# Patient Record
Sex: Female | Born: 1935 | Race: Black or African American | Hispanic: No
Health system: Southern US, Community
[De-identification: ages and names within clinical notes are randomized; demographics above are authoritative.]

## PROBLEM LIST (undated history)

## (undated) DIAGNOSIS — I639 Cerebral infarction, unspecified: Secondary | ICD-10-CM

## (undated) DIAGNOSIS — I10 Essential (primary) hypertension: Secondary | ICD-10-CM

## (undated) DIAGNOSIS — E119 Type 2 diabetes mellitus without complications: Secondary | ICD-10-CM

## (undated) HISTORY — PX: CHOLECYSTECTOMY: SHX55

---

## 2017-06-15 ENCOUNTER — Encounter (HOSPITAL_COMMUNITY): Payer: Self-pay | Admitting: Emergency Medicine

## 2017-06-15 ENCOUNTER — Emergency Department (HOSPITAL_COMMUNITY): Payer: Medicare Other

## 2017-06-15 ENCOUNTER — Inpatient Hospital Stay (HOSPITAL_COMMUNITY)
Admission: EM | Admit: 2017-06-15 | Discharge: 2017-06-18 | DRG: 871 | Disposition: A | Discharge status: Hospice | Payer: Medicare Other | Attending: Internal Medicine | Admitting: Internal Medicine

## 2017-06-15 ENCOUNTER — Inpatient Hospital Stay (HOSPITAL_COMMUNITY): Payer: Medicare Other

## 2017-06-15 DIAGNOSIS — Z7982 Long term (current) use of aspirin: Secondary | ICD-10-CM

## 2017-06-15 DIAGNOSIS — Z794 Long term (current) use of insulin: Secondary | ICD-10-CM | POA: Diagnosis not present

## 2017-06-15 DIAGNOSIS — E119 Type 2 diabetes mellitus without complications: Secondary | ICD-10-CM | POA: Diagnosis present

## 2017-06-15 DIAGNOSIS — N179 Acute kidney failure, unspecified: Secondary | ICD-10-CM | POA: Diagnosis present

## 2017-06-15 DIAGNOSIS — E875 Hyperkalemia: Secondary | ICD-10-CM | POA: Diagnosis present

## 2017-06-15 DIAGNOSIS — I69351 Hemiplegia and hemiparesis following cerebral infarction affecting right dominant side: Secondary | ICD-10-CM

## 2017-06-15 DIAGNOSIS — Z66 Do not resuscitate: Secondary | ICD-10-CM | POA: Diagnosis present

## 2017-06-15 DIAGNOSIS — Z6820 Body mass index (BMI) 20.0-20.9, adult: Secondary | ICD-10-CM

## 2017-06-15 DIAGNOSIS — J13 Pneumonia due to Streptococcus pneumoniae: Secondary | ICD-10-CM | POA: Diagnosis not present

## 2017-06-15 DIAGNOSIS — I82409 Acute embolism and thrombosis of unspecified deep veins of unspecified lower extremity: Secondary | ICD-10-CM

## 2017-06-15 DIAGNOSIS — J189 Pneumonia, unspecified organism: Secondary | ICD-10-CM

## 2017-06-15 DIAGNOSIS — G9341 Metabolic encephalopathy: Secondary | ICD-10-CM | POA: Diagnosis present

## 2017-06-15 DIAGNOSIS — M7989 Other specified soft tissue disorders: Secondary | ICD-10-CM | POA: Diagnosis not present

## 2017-06-15 DIAGNOSIS — K921 Melena: Secondary | ICD-10-CM | POA: Diagnosis present

## 2017-06-15 DIAGNOSIS — R6521 Severe sepsis with septic shock: Secondary | ICD-10-CM | POA: Diagnosis present

## 2017-06-15 DIAGNOSIS — I10 Essential (primary) hypertension: Secondary | ICD-10-CM | POA: Diagnosis present

## 2017-06-15 DIAGNOSIS — I82441 Acute embolism and thrombosis of right tibial vein: Secondary | ICD-10-CM | POA: Diagnosis present

## 2017-06-15 DIAGNOSIS — R634 Abnormal weight loss: Secondary | ICD-10-CM | POA: Diagnosis present

## 2017-06-15 DIAGNOSIS — Z79899 Other long term (current) drug therapy: Secondary | ICD-10-CM | POA: Diagnosis not present

## 2017-06-15 DIAGNOSIS — J181 Lobar pneumonia, unspecified organism: Secondary | ICD-10-CM | POA: Diagnosis present

## 2017-06-15 DIAGNOSIS — Z515 Encounter for palliative care: Secondary | ICD-10-CM | POA: Diagnosis present

## 2017-06-15 DIAGNOSIS — G929 Unspecified toxic encephalopathy: Secondary | ICD-10-CM

## 2017-06-15 DIAGNOSIS — K922 Gastrointestinal hemorrhage, unspecified: Secondary | ICD-10-CM

## 2017-06-15 DIAGNOSIS — A419 Sepsis, unspecified organism: Principal | ICD-10-CM

## 2017-06-15 DIAGNOSIS — I82431 Acute embolism and thrombosis of right popliteal vein: Secondary | ICD-10-CM | POA: Diagnosis present

## 2017-06-15 DIAGNOSIS — G92 Toxic encephalopathy: Secondary | ICD-10-CM

## 2017-06-15 DIAGNOSIS — E109 Type 1 diabetes mellitus without complications: Secondary | ICD-10-CM

## 2017-06-15 DIAGNOSIS — R627 Adult failure to thrive: Secondary | ICD-10-CM | POA: Diagnosis present

## 2017-06-15 HISTORY — DX: Essential (primary) hypertension: I10

## 2017-06-15 HISTORY — DX: Type 2 diabetes mellitus without complications: E11.9

## 2017-06-15 HISTORY — DX: Cerebral infarction, unspecified: I63.9

## 2017-06-15 LAB — CBC WITH DIFFERENTIAL/PLATELET
Basophils Absolute: 0 10*3/uL (ref 0.0–0.1)
Basophils Relative: 0 %
EOS PCT: 0 %
Eosinophils Absolute: 0 10*3/uL (ref 0.0–0.7)
HCT: 36 % (ref 36.0–46.0)
Hemoglobin: 11.4 g/dL — ABNORMAL LOW (ref 12.0–15.0)
LYMPHS PCT: 13 %
Lymphs Abs: 3.2 10*3/uL (ref 0.7–4.0)
MCH: 28.8 pg (ref 26.0–34.0)
MCHC: 31.7 g/dL (ref 30.0–36.0)
MCV: 90.9 fL (ref 78.0–100.0)
MONO ABS: 0.7 10*3/uL (ref 0.1–1.0)
MONOS PCT: 3 %
NEUTROS PCT: 84 %
Neutro Abs: 21 10*3/uL — ABNORMAL HIGH (ref 1.7–7.7)
PLATELETS: 519 10*3/uL — AB (ref 150–400)
RBC: 3.96 MIL/uL (ref 3.87–5.11)
RDW: 15.5 % (ref 11.5–15.5)
WBC: 24.9 10*3/uL — AB (ref 4.0–10.5)

## 2017-06-15 LAB — CBC
HEMATOCRIT: 30.5 % — AB (ref 36.0–46.0)
Hemoglobin: 9.3 g/dL — ABNORMAL LOW (ref 12.0–15.0)
MCH: 29.4 pg (ref 26.0–34.0)
MCHC: 30.5 g/dL (ref 30.0–36.0)
MCV: 96.5 fL (ref 78.0–100.0)
Platelets: 455 10*3/uL — ABNORMAL HIGH (ref 150–400)
RBC: 3.16 MIL/uL — ABNORMAL LOW (ref 3.87–5.11)
RDW: 15.4 % (ref 11.5–15.5)
WBC: 31.5 10*3/uL — ABNORMAL HIGH (ref 4.0–10.5)

## 2017-06-15 LAB — POC OCCULT BLOOD, ED: Fecal Occult Bld: POSITIVE — AB

## 2017-06-15 LAB — URINALYSIS, ROUTINE W REFLEX MICROSCOPIC
BILIRUBIN URINE: NEGATIVE
GLUCOSE, UA: 50 mg/dL — AB
Ketones, ur: 20 mg/dL — AB
Nitrite: NEGATIVE
PH: 6 (ref 5.0–8.0)
Protein, ur: 100 mg/dL — AB
SPECIFIC GRAVITY, URINE: 1.009 (ref 1.005–1.030)

## 2017-06-15 LAB — BASIC METABOLIC PANEL
BUN: 71 mg/dL — ABNORMAL HIGH (ref 6–20)
CHLORIDE: 98 mmol/L — AB (ref 101–111)
CO2: <7 mmol/L — ABNORMAL LOW (ref 22–32)
Calcium: 9 mg/dL (ref 8.9–10.3)
Creatinine, Ser: 7.81 mg/dL — ABNORMAL HIGH (ref 0.44–1.00)
GFR calc non Af Amer: 4 mL/min — ABNORMAL LOW (ref >60)
GFR, EST AFRICAN AMERICAN: 5 mL/min — AB (ref >60)
Glucose, Bld: 126 mg/dL — ABNORMAL HIGH (ref 65–99)
POTASSIUM: 5.5 mmol/L — AB (ref 3.5–5.1)
Sodium: 138 mmol/L (ref 135–145)

## 2017-06-15 LAB — TROPONIN I: Troponin I: 0.03 ng/mL (ref <0.03)

## 2017-06-15 LAB — LACTIC ACID, PLASMA: LACTIC ACID, VENOUS: 18.6 mmol/L — AB (ref 0.5–1.9)

## 2017-06-15 LAB — GLUCOSE, CAPILLARY: GLUCOSE-CAPILLARY: 120 mg/dL — AB (ref 65–99)

## 2017-06-15 LAB — MRSA PCR SCREENING: MRSA BY PCR: NEGATIVE

## 2017-06-15 MED ORDER — VANCOMYCIN HCL IN DEXTROSE 1-5 GM/200ML-% IV SOLN
1000.0000 mg | Freq: Once | INTRAVENOUS | Status: AC
  Administered 2017-06-15: 1000 mg via INTRAVENOUS
  Filled 2017-06-15: qty 200

## 2017-06-15 MED ORDER — HEPARIN SODIUM (PORCINE) 5000 UNIT/ML IJ SOLN
5000.0000 [IU] | Freq: Three times a day (TID) | INTRAMUSCULAR | Status: DC
  Administered 2017-06-15 – 2017-06-17 (×5): 5000 [IU] via SUBCUTANEOUS
  Filled 2017-06-15 (×5): qty 1

## 2017-06-15 MED ORDER — ONDANSETRON HCL 4 MG/2ML IJ SOLN: 4.0000 mg | Freq: Four times a day (QID) | INTRAMUSCULAR | Status: DC | PRN

## 2017-06-15 MED ORDER — INSULIN ASPART 100 UNIT/ML ~~LOC~~ SOLN
0.0000 [IU] | Freq: Three times a day (TID) | SUBCUTANEOUS | Status: DC
  Administered 2017-06-16: 1 [IU] via SUBCUTANEOUS

## 2017-06-15 MED ORDER — SODIUM CHLORIDE 0.9 % IV SOLN
INTRAVENOUS | Status: DC
  Administered 2017-06-15 – 2017-06-17 (×5): via INTRAVENOUS

## 2017-06-15 MED ORDER — PANTOPRAZOLE SODIUM 40 MG IV SOLR
40.0000 mg | INTRAVENOUS | Status: DC
  Administered 2017-06-15 – 2017-06-16 (×2): 40 mg via INTRAVENOUS
  Filled 2017-06-15 (×2): qty 40

## 2017-06-15 MED ORDER — SODIUM CHLORIDE 0.9 % IV BOLUS (SEPSIS)
2000.0000 mL | Freq: Once | INTRAVENOUS | Status: AC
  Administered 2017-06-15: 2000 mL via INTRAVENOUS

## 2017-06-15 MED ORDER — SODIUM CHLORIDE 0.9 % IV BOLUS (SEPSIS)
500.0000 mL | Freq: Once | INTRAVENOUS | Status: AC
  Administered 2017-06-15: 500 mL via INTRAVENOUS

## 2017-06-15 MED ORDER — DEXTROSE 5 % IV SOLN
2.0000 g | Freq: Once | INTRAVENOUS | Status: AC
  Administered 2017-06-15: 2 g via INTRAVENOUS
  Filled 2017-06-15: qty 2

## 2017-06-15 MED ORDER — DEXTROSE 5 % IV SOLN
500.0000 mg | INTRAVENOUS | Status: DC
  Administered 2017-06-16: 500 mg via INTRAVENOUS
  Filled 2017-06-15 (×2): qty 0.5

## 2017-06-15 NOTE — ED Notes (Signed)
Patient transported to X-ray 

## 2017-06-15 NOTE — ED Notes (Signed)
hospitalist at bedside

## 2017-06-15 NOTE — Progress Notes (Signed)
On admission to ICU patient noted to be cold temperature not registering axillary or oraly, Rectal temp read 89.5 farenheit machines switched out and equipment changed to verify temp and second showed 89.6 farenheit. MD paged and warm blankets applied.

## 2017-06-15 NOTE — Progress Notes (Signed)
eLink Physician-Brief Progress Note Patient Name: Krista Smith DOB: [birth year] MRN: [id number]   Date of Service  06/15/2017  HPI/Events of Note  Old CVA with sepsis, ? RLL pna vs UTI, hypothermic, AKI  eICU Interventions  Check lactate      Intervention Category Evaluation Type: New Patient Evaluation  ALVA,RAKESH V. 06/15/2017, 5:57 PM

## 2017-06-15 NOTE — Consult Note (Addendum)
Renal Service Consult Note [facility]  Krista Smith 06/15/2017 Delano Metz D Requesting Physician:  Dr Mickle Mallory  Reason for Consult:  Renal failure HPI: The patient is a 83 y.o. year-old with hx of CVA May 2018, HTN, DM2 on oral agents who presented to ED today with FTT, not eating x 5 mos since CVA, dark stool, poor UOP.  In ED creat was 7, pt admitted, renal team consulted for renal failure.  Pt's daughter denies any hx of renal failure, no nsaid use.  Pt had CVA in May in [place] w/ R sided weakness and R facial droop.  She says that since then she has not had good po intake. She has lost " 50 pounds".  Her daughter moved her here to [place] with her husband in the last 2-3 weeks.  Still not improving so brought her to ED today.  Pt provides no hx.   Per family pt has long hx of HTN, and 10 yr hx DM on oral agents.  Only surg was possible GB surgery.  NO hx MI/ CHF/ cancer/ DVT.     ROS  n/a   Past Medical History  Past Medical History:  Diagnosis Date  . Diabetes mellitus without complication (HCC)   . Hypertension   . Stroke [facility])    Past Surgical History  Past Surgical History:  Procedure Laterality Date  . CHOLECYSTECTOMY     Family History No family history on file. Social History  has no tobacco, alcohol, and drug history on file. Allergies No Known Allergies Home medications Prior to Admission medications   Medication Sig Start Date End Date Taking? Authorizing Provider  ASPIRIN LOW DOSE 81 MG chewable tablet Chew 81 mg by mouth daily. 03/12/17  Yes [provider]  atenolol-chlorthalidone (TENORETIC) 100-25 MG tablet Take 1 tablet by mouth daily. 06/06/17  Yes [provider]  LANTUS SOLOSTAR 100 UNIT/ML Solostar Pen Inject 24 Units into the skin every evening. 03/13/17  Yes [provider]  metFORMIN (GLUCOPHAGE) 850 MG tablet Take 850 mg by mouth daily with breakfast. 06/06/17  Yes [provider]  NIFEdipine  (PROCARDIA XL/ADALAT-CC) 60 MG 24 hr tablet Take 60 mg by mouth daily. 06/06/17  Yes [provider]   Liver Function Tests  Recent Labs Lab 06/15/17 1123  AST 36  ALT <5*  ALKPHOS 80  BILITOT 1.1  PROT 7.4  ALBUMIN 3.4*   No results for input(s): LIPASE, AMYLASE in the last 168 hours. CBC  Recent Labs Lab 06/15/17 1123  WBC 24.9*  NEUTROABS 21.0*  HGB 11.4*  HCT 36.0  MCV 90.9  PLT 519*   Basic Metabolic Panel  Recent Labs Lab 06/15/17 1123  NA 138  K 5.0  CL 94*  CO2 6*  GLUCOSE 83  BUN 68*  CREATININE 7.74*  CALCIUM 9.6   Iron/TIBC/Ferritin/ %Sat No results found for: IRON, TIBC, FERRITIN, IRONPCTSAT  Vitals:   06/15/17 1230 06/15/17 1300 06/15/17 1330 06/15/17 1400  BP: 140/69 138/63 140/75 139/68  Pulse:   72 71  Resp: (!) 22 (!) 23 (!) 23 19  Temp:      TempSrc:      SpO2:   100% 100%   Exam Gen elderly AAF, moaning, able to respond ok, restless, confused, awake +Kussmaul's respirations No rash, cyanosis or gangrene Sclera anicteric, throat clear  No jvd or bruits Chest clear bilat to bases RRR no MRG Abd soft ntnd no mass or ascites +bs GU defer MS no joint effusions  or deformity Ext 1+ RLE edema , no LLE edema / no wounds or ulcers Neuro R facial droop, gen'd weakness, O x 1  Home meds: -procardia XL 60 qd/ atenolol- chlorthalidone 100- 25 qd/ asa -lantus solostar 24 u qpm / metformin 850 qam    Na 138 K 5.0  CO2 6  BUN 68  Cr 7.74  Ca 9.6  Alb 3.4  Hb 11.4  WBC 24.9 UA hazy, yellow, +ketones, mod LE/ neg nitrite, 6 pH, 100 prot, 6-30 rbc/ wbc, few bact CXR - basically no active disease, per rad possible RLL infiltrate (early) Renal US > Length: 10.2 cm. Echogenicity within normal limits. No mass or hydronephrosis visualized.  Left Kidney: Length: 10.3 cm. Echogenicity within normal limits. No mass or hydronephrosis visualized.  Impression: 1.  Renal failure - possibly acute KI, related to vol depletion.  No ACEi/ARB or  nsaids.  Not septic, but looks dry on exam.  BP's wnl.  Could have GN with abnl UA. Renal US not c/w chronic / severe disease.  Will assume AKI for now, due to vol depletion.  Will give IVF's with 2 L bolus, cont IVF's, dc IV vanc  Hopefully Cr will improve.  2.  Gen weakness/ FTT - multifact 3.  Hx CVA - may 2019, R hemiparesis 4.  DM2 - getting SSI here 5.  HTN - taking 3 BP meds at baseline    Plan - as above  Vinson Moselle MD [employer] pager [phone]   06/15/2017, 3:38 PM

## 2017-06-15 NOTE — Progress Notes (Signed)
CRITICAL VALUE ALERT  Critical Value:  LACTIC 18.6  Date & Time Notied:  06/15/17 1946  Provider Notified: YES, PAGED 1958  Orders Received/Actions taken: SENT PAGE, AWAITING ORDERS

## 2017-06-15 NOTE — ED Provider Notes (Addendum)
[facility]-EMERGENCY DEPT Provider Note   CSN: [id number] Arrival date & time: 06/15/17  0931     History   Chief Complaint Chief Complaint  Patient presents with  . Melena    HPI Krista Smith is a 83 y.o. female.  HPI Level 5 caveat due to altered mental status  Patient brought in by daughter. Recently moved from [place]. Had a stroke in May. Since then has been dwindling down. Has lost around 50 pounds. Has had decreased oral intake. Has been having nausea and had some Tesoro stool yesterday. Decreased oral intake. No fevers. Has also coughed up some green sputum. Has some mild confusion. Normally gets up with assistance but is unable to that recently. Had reportedly had UTI. Just started on medications for it. Does not have a primary care doctor here yet. Normally speaks quiet and has some diffuse wheezing this but worse on the right side. Has been even more week all over for the last few days. Family is concerned she has a urinary tract infection. Past Medical History:  Diagnosis Date  . Diabetes mellitus without complication (HCC)   . Hypertension   . Stroke [facility])     There are no active problems to display for this patient.   Past Surgical History:  Procedure Laterality Date  . CHOLECYSTECTOMY      OB History    No data available       Home Medications    Prior to Admission medications   Medication Sig Start Date End Date Taking? Authorizing Provider  ASPIRIN LOW DOSE 81 MG chewable tablet Chew 81 mg by mouth daily. 03/12/17  Yes [provider]  atenolol-chlorthalidone (TENORETIC) 100-25 MG tablet Take 1 tablet by mouth daily. 06/06/17  Yes [provider]  LANTUS SOLOSTAR 100 UNIT/ML Solostar Pen Inject 24 Units into the skin every evening. 03/13/17  Yes [provider]  metFORMIN (GLUCOPHAGE) 850 MG tablet Take 850 mg by mouth daily with breakfast. 06/06/17  Yes [provider]  NIFEdipine (PROCARDIA XL/ADALAT-CC) 60 MG 24 hr  tablet Take 60 mg by mouth daily. 06/06/17  Yes [provider]    Family History No family history on file.  Social History Social History  Substance Use Topics  . Smoking status: Not on file  . Smokeless tobacco: Not on file  . Alcohol use Not on file     Allergies   Patient has no known allergies.   Review of Systems Review of Systems  Unable to perform ROS: Mental status change  Constitutional: Positive for appetite change.     Physical Exam Updated Vital Signs BP (!) 141/66   Pulse 65   Temp 97.9 F (36.6 C) (Oral)   Resp (!) 21   SpO2 100%   Physical Exam  Constitutional: She appears well-developed.  HENT:  Head: Normocephalic.  Eyes: Pupils are equal, round, and reactive to light.  Neck: Neck supple.  Cardiovascular: Normal rate.   Pulmonary/Chest: Effort normal.  Abdominal: Soft. There is no tenderness.  Genitourinary:  Genitourinary Comments: Brown stool but anterior mass on rectal exam.  Musculoskeletal:  Edema on bilateral lower extremities, worse on the right side.  Neurological: She is alert.  [place] to answer. Answer some questions but also appears to have some confusion. Most of the history comes from the patient's daughter.  Skin: Skin is warm. Capillary refill takes less than 2 seconds.     ED Treatments / Results  Labs (all labs ordered are listed, but only abnormal  results are displayed) Labs Reviewed  COMPREHENSIVE METABOLIC PANEL - Abnormal; Notable for the following:       Result Value   Chloride 94 (*)    CO2 6 (*)    BUN 68 (*)    Creatinine, Ser 7.74 (*)    Albumin 3.4 (*)    ALT <5 (*)    GFR calc non Af Amer 4 (*)    GFR calc Af Amer 5 (*)    Anion gap 38 (*)    All other components within normal limits  CBC WITH DIFFERENTIAL/PLATELET - Abnormal; Notable for the following:    WBC 24.9 (*)    Hemoglobin 11.4 (*)    Platelets 519 (*)    Neutro Abs 21.0 (*)    All other components within normal limits  TROPONIN  I - Abnormal; Notable for the following:    Troponin I 0.03 (*)    All other components within normal limits  POC OCCULT BLOOD, ED - Abnormal; Notable for the following:    Fecal Occult Bld POSITIVE (*)    All other components within normal limits  URINALYSIS, ROUTINE W REFLEX MICROSCOPIC    EKG  EKG Interpretation  Date/Time:  Sunday June 15 2017 12:03:16 EDT Ventricular Rate:  64 PR Interval:    QRS Duration: 103 QT Interval:  523 QTC Calculation: 540 R Axis:   9 Text Interpretation:  Sinus or ectopic atrial rhythm Prolonged QT interval Confirmed by Benjiman Core [phone]) on 06/15/2017 12:52:05 PM       Radiology Dg Chest 2 View  Result Date: 06/15/2017 CLINICAL DATA:  Acute weakness.  CVA in 01/2017. EXAM: CHEST  2 VIEW COMPARISON:  None. FINDINGS: The cardiomediastinal silhouette is unremarkable. Ill-defined 3 cm opacity within the right lower lobe may represent pneumonia. There is no evidence of pulmonary edema, suspicious pulmonary nodule/mass, pleural effusion, or pneumothorax. No acute bony abnormalities are identified. IMPRESSION: Ill-defined right lower lobe opacity which may represent pneumonia. Radiographic follow-up to resolution recommended. Electronically Signed   By: Harmon Pier M.D.   On: 06/15/2017 12:11    Procedures Procedures (including critical care time)  Medications Ordered in ED Medications  vancomycin (VANCOCIN) IVPB 1000 mg/200 mL premix (not administered)  sodium chloride 0.9 % bolus 500 mL (not administered)  ceFEPIme (MAXIPIME) 2 g in dextrose 5 % 50 mL IVPB (2 g Intravenous New Bag/Given 06/15/17 1307)     Initial Impression / Assessment and Plan / ED Course  I have reviewed the triage vital signs and the nursing notes.  Pertinent labs & imaging results that were available during my care of the patient were reviewed by me and considered in my medical decision making (see chart for details).     Patient with decreased oral intake and  has had confusions. Reportedly has been coughing up sputum and possible pneumonia on x-ray. Creatinine is now 7.7 however. There is concern with family. Tract infection with patient is not able to urinate here and will be checking a bladder scan. Will admit to hospitalist. Also was guaiac positive. Did have a fullness anteriorly on rectal exam. Only 30 mL of urine on bladder scan. Will do in and out cath.  Final Clinical Impressions(s) / ED Diagnoses   Final diagnoses:  Community acquired pneumonia, unspecified laterality  Acute kidney injury (HCC)  Gastrointestinal hemorrhage, unspecified gastrointestinal hemorrhage type    New Prescriptions New Prescriptions   No medications on file     Benjiman Core, MD 06/15/17 1349  Benjiman Core, MD 06/15/17 1353

## 2017-06-15 NOTE — Progress Notes (Signed)
Pharmacy Antibiotic Note  Krista Smith is a 83 y.o. female admitted on 06/15/2017 with HCAP.  Pharmacy has been consulted for cefepime and vancomycin dosing.  Plan:  Cefepime 500 mg q24 hr; f/u SCr closely  Vanc discontinued by nephrology d/t AKI. Nephrologist did not want to add another MRSA-active agent (did not feel patient actually had PNA)     Temp (24hrs), Avg:97.9 F (36.6 C), Min:97.9 F (36.6 C), Max:97.9 F (36.6 C)   Recent Labs Lab 06/15/17 1123  WBC 24.9*  CREATININE 7.74*    CrCl cannot be calculated (Unknown ideal weight.).    No Known Allergies  Antimicrobials this admission: Vanc x 1 in ED Cefepime 9/16 >>   Dose adjustments this admission: ---  Microbiology results: 9/16 BCx: sent 9/16 Sputum: sent  9/16 MRSA PCR: sent  Thank you for allowing pharmacy to be a part of this patient's care.  Bernadene Person, PharmD, BCPS Pager: [phone] 06/15/2017, 5:23 PM

## 2017-06-15 NOTE — Progress Notes (Signed)
Pharmacy Note:  Initial antibiotics for Vancomycin & Cefepime ordered by EDP for presumed HCAP.  CrCl cannot be calculated (Unknown ideal weight.).   No Known Allergies  Vitals:   06/15/17 1159 06/15/17 1200  BP: (!) 142/68 (!) 141/66  Pulse: 65   Resp: (!) 23 (!) 21  Temp:    SpO2: 100%     Anti-infectives    Start     Dose/Rate Route Frequency Ordered Stop   06/15/17 1300  ceFEPIme (MAXIPIME) 2 g in dextrose 5 % 50 mL IVPB     2 g 100 mL/hr over 30 Minutes Intravenous  Once 06/15/17 1249     06/15/17 1300  vancomycin (VANCOCIN) IVPB 1000 mg/200 mL premix     1,000 mg 200 mL/hr over 60 Minutes Intravenous  Once 06/15/17 1249        Plan: Initial doses of Cefepime 2gm & Vancomycin 1gm  X 1 ordered. F/U admission orders for further dosing if therapy continued. Protocol orders discontinued  Otho Bellows, [facility] 06/15/2017 12:57 PM

## 2017-06-15 NOTE — ED Notes (Signed)
Off floor for testing 

## 2017-06-15 NOTE — H&P (Signed)
History and Physical  Krista Smith ONG:[id number] DOB: [birth year] DOA: 06/15/2017  PCP:  No primary care provider on file.   Chief Complaint:  AMS weakness  History of Present Illness:  Pt is a 83 yo female with hx of HTN, DMII, CVA who was brought with cc of AMS and weakness. Pt had CVA in may s/p rehab in [place] then her daughter had her moved to here to help her out ( daughter is a PT) as she was not happy with her physical therapy progress. The patient has not been doing well and continued to have weakness and poor appetite and lost 50 lbs over 5 months. Three days ago she started having AMS/being confused and today she had several episodes of vomiting of thick green mucus and had tarry stool but did not complaint of pain/dysuria and did not have fever/chills/chest pain/dyspnea. Pt is unable to give history.   Review of Systems:  Unable to get due to AMS  Other:  Past Medical and Surgical History:   Past Medical History:  Diagnosis Date  . Diabetes mellitus without complication (HCC)   . Hypertension   . Stroke [facility])    Past Surgical History:  Procedure Laterality Date  . CHOLECYSTECTOMY      Social History:   has no tobacco, alcohol, and drug history on file.    No Known Allergies  No family history on file.    Prior to Admission medications   Medication Sig Start Date End Date Taking? Authorizing Provider  ASPIRIN LOW DOSE 81 MG chewable tablet Chew 81 mg by mouth daily. 03/12/17  Yes [provider]  atenolol-chlorthalidone (TENORETIC) 100-25 MG tablet Take 1 tablet by mouth daily. 06/06/17  Yes [provider]  LANTUS SOLOSTAR 100 UNIT/ML Solostar Pen Inject 24 Units into the skin every evening. 03/13/17  Yes [provider]  metFORMIN (GLUCOPHAGE) 850 MG tablet Take 850 mg by mouth daily with breakfast. 06/06/17  Yes [provider]  NIFEdipine (PROCARDIA XL/ADALAT-CC) 60 MG 24 hr tablet Take 60 mg by mouth daily.  06/06/17  Yes [provider]    Physical Exam: BP 139/68   Pulse 71   Temp 97.9 F (36.6 C) (Oral)   Resp 19   SpO2 100%   GENERAL :   Awake, oriented to city but not to people , time or self.  HEAD:           normocephalic. EARS:           hearing grossly intact. NECK:          supple, CARDIAC:    Normal S1 and S2. No gallop. No murmurs.  Vascular:     + L leg peripheral edema.  LUNGS:        Mild crackles  ABDOMEN: Positive bowel sounds. Soft, nondistended, nontender.  MSK:           No joint erythema or tenderness.  EXT           : No significant deformity or joint abnormality. Neuro        : unable to assess due to AMS. Moving left leg/arm and right arm voluntarily  SKIN:            No rash. No lesions.           Labs on Admission:  Reviewed.   Radiological Exams on Admission: Dg Chest 2 View  Result Date: 06/15/2017 CLINICAL DATA:  Acute weakness.  CVA in 01/2017. EXAM: CHEST  2 VIEW COMPARISON:  None. FINDINGS: The cardiomediastinal silhouette is unremarkable. Ill-defined 3 cm opacity within the right lower lobe may represent pneumonia. There is no evidence of pulmonary edema, suspicious pulmonary nodule/mass, pleural effusion, or pneumothorax. No acute bony abnormalities are identified. IMPRESSION: Ill-defined right lower lobe opacity which may represent pneumonia. Radiographic follow-up to resolution recommended. Electronically Signed   By: Harmon Pier M.D.   On: 06/15/2017 12:11    EKG:  Independently reviewed. Sinus rythem  Assessment/Plan  Altered mental status: Baseline: A,O#3 Ddx:   Neurologic:           Structural (mass)/ bleeding: head CT scan ordered          Stroke / TIA: will check MRI if not improving by am Infectious:           UA ordered. CXR suggest pneumonia  Metabolic : uremia/ AKI possibly contributing              Sepsis :  Due to pneumonia Started on van/cefepime , send for bcx and sputum cx  AKI: Unknown if she has  underlying CKD Will get renal US Continue IVF Nephrology consult   Melena:  Only this morning per daughter No hematemesis Hold asp, keep NPO for now Repeat H&H in pm Consult to GI   HTN: hold BP meds for now  DMII: hold meds and keep on SS insulin   CVA: hold asp due to GIB, need more PT before/upon discharge.   Weight loss/poor appetite: need age appropriate cancer screening once stable   Input & Output: ordered  Lines & Tubes: PIV DVT prophylaxis: SCDs GI prophylaxis: PPI  Consultants: GI / nephro  Code Status: full Family Communication: at bedside  Disposition Plan: TBD    Krista Smith M.D [facility]

## 2017-06-15 NOTE — ED Triage Notes (Signed)
Patient BIB daughter, reports patient moved to live with her x2 weeks ago. Since stroke in May, daughter reports patient has had decreased appetite and poor nutrition. Daughter reports since yesterday, patient has been spitting up mucus and has had one dark stool. Patient denies pain. Daughter denies patient coughing. A&Ox3 in triage. Daughter expressing concern for UTI.

## 2017-06-15 NOTE — ED Notes (Signed)
Pt's bladder scan: 39 ml

## 2017-06-15 NOTE — ED Notes (Addendum)
Notified hospitalist of one set of cultures sent after hanging antibiotics-stated that was fine-US at bedside

## 2017-06-16 ENCOUNTER — Encounter (HOSPITAL_COMMUNITY): Payer: Self-pay

## 2017-06-16 ENCOUNTER — Inpatient Hospital Stay (HOSPITAL_COMMUNITY): Payer: Medicare Other

## 2017-06-16 ENCOUNTER — Encounter (HOSPITAL_COMMUNITY): Payer: Medicare Other

## 2017-06-16 DIAGNOSIS — E875 Hyperkalemia: Secondary | ICD-10-CM

## 2017-06-16 DIAGNOSIS — N179 Acute kidney failure, unspecified: Secondary | ICD-10-CM

## 2017-06-16 DIAGNOSIS — I10 Essential (primary) hypertension: Secondary | ICD-10-CM

## 2017-06-16 DIAGNOSIS — G929 Unspecified toxic encephalopathy: Secondary | ICD-10-CM

## 2017-06-16 DIAGNOSIS — G92 Toxic encephalopathy: Secondary | ICD-10-CM

## 2017-06-16 DIAGNOSIS — K921 Melena: Secondary | ICD-10-CM

## 2017-06-16 DIAGNOSIS — J181 Lobar pneumonia, unspecified organism: Secondary | ICD-10-CM

## 2017-06-16 DIAGNOSIS — E109 Type 1 diabetes mellitus without complications: Secondary | ICD-10-CM

## 2017-06-16 DIAGNOSIS — M7989 Other specified soft tissue disorders: Secondary | ICD-10-CM

## 2017-06-16 LAB — CBC
HCT: 27.2 % — ABNORMAL LOW (ref 36.0–46.0)
Hemoglobin: 8.4 g/dL — ABNORMAL LOW (ref 12.0–15.0)
MCH: 29.4 pg (ref 26.0–34.0)
MCHC: 30.9 g/dL (ref 30.0–36.0)
MCV: 95.1 fL (ref 78.0–100.0)
Platelets: 381 10*3/uL (ref 150–400)
RBC: 2.86 MIL/uL — AB (ref 3.87–5.11)
RDW: 15.8 % — AB (ref 11.5–15.5)
WBC: 38.7 10*3/uL — ABNORMAL HIGH (ref 4.0–10.5)

## 2017-06-16 LAB — COMPREHENSIVE METABOLIC PANEL
ALBUMIN: 3.4 g/dL — AB (ref 3.5–5.0)
ALK PHOS: 80 U/L (ref 38–126)
ALK PHOS: 63 U/L (ref 38–126)
ALT: <5 U/L — ABNORMAL LOW (ref 14–54)
ALT: 30 U/L (ref 14–54)
ANION GAP: 38 — AB (ref 5–15)
AST: 36 U/L (ref 15–41)
AST: 60 U/L — AB (ref 15–41)
Albumin: 2.8 g/dL — ABNORMAL LOW (ref 3.5–5.0)
BILIRUBIN TOTAL: 1.1 mg/dL (ref 0.3–1.2)
BUN: 68 mg/dL — ABNORMAL HIGH (ref 6–20)
BUN: 76 mg/dL — AB (ref 6–20)
CALCIUM: 9.6 mg/dL (ref 8.9–10.3)
CALCIUM: 8.3 mg/dL — AB (ref 8.9–10.3)
CHLORIDE: 102 mmol/L (ref 101–111)
CO2: <7 mmol/L — ABNORMAL LOW (ref 22–32)
CO2: <7 mmol/L — ABNORMAL LOW (ref 22–32)
CREATININE: 7.66 mg/dL — AB (ref 0.44–1.00)
Chloride: 94 mmol/L — ABNORMAL LOW (ref 101–111)
Creatinine, Ser: 7.74 mg/dL — ABNORMAL HIGH (ref 0.44–1.00)
GFR, EST AFRICAN AMERICAN: 5 mL/min — AB (ref >60)
GFR, EST AFRICAN AMERICAN: 5 mL/min — AB (ref >60)
GFR, EST NON AFRICAN AMERICAN: 4 mL/min — AB (ref >60)
GFR, EST NON AFRICAN AMERICAN: 4 mL/min — AB (ref >60)
GLUCOSE: 83 mg/dL (ref 65–99)
Glucose, Bld: 118 mg/dL — ABNORMAL HIGH (ref 65–99)
Potassium: 5 mmol/L (ref 3.5–5.1)
Potassium: 6.2 mmol/L — ABNORMAL HIGH (ref 3.5–5.1)
SODIUM: 139 mmol/L (ref 135–145)
Sodium: 138 mmol/L (ref 135–145)
TOTAL PROTEIN: 7.4 g/dL (ref 6.5–8.1)
Total Bilirubin: 1.1 mg/dL (ref 0.3–1.2)
Total Protein: 5.5 g/dL — ABNORMAL LOW (ref 6.5–8.1)

## 2017-06-16 LAB — GLUCOSE, CAPILLARY
GLUCOSE-CAPILLARY: 100 mg/dL — AB (ref 65–99)
GLUCOSE-CAPILLARY: 89 mg/dL (ref 65–99)
GLUCOSE-CAPILLARY: 65 mg/dL (ref 65–99)
Glucose-Capillary: 127 mg/dL — ABNORMAL HIGH (ref 65–99)
Glucose-Capillary: 164 mg/dL — ABNORMAL HIGH (ref 65–99)

## 2017-06-16 LAB — HEMOGLOBIN A1C
Hgb A1c MFr Bld: 6.8 % — ABNORMAL HIGH (ref 4.8–5.6)
Mean Plasma Glucose: 148.46 mg/dL

## 2017-06-16 LAB — LACTIC ACID, PLASMA
LACTIC ACID, VENOUS: 16.9 mmol/L — AB (ref 0.5–1.9)
Lactic Acid, Venous: 15.3 mmol/L (ref 0.5–1.9)

## 2017-06-16 LAB — POTASSIUM: Potassium: 6.2 mmol/L — ABNORMAL HIGH (ref 3.5–5.1)

## 2017-06-16 MED ORDER — DEXTROSE 50 % IV SOLN
50.0000 mL | Freq: Once | INTRAVENOUS | Status: AC
  Administered 2017-06-16: 50 mL via INTRAVENOUS

## 2017-06-16 MED ORDER — MORPHINE SULFATE (PF) 4 MG/ML IV SOLN: 1.0000 mg | INTRAVENOUS | Status: DC | PRN

## 2017-06-16 MED ORDER — LORAZEPAM 2 MG/ML IJ SOLN: 0.5000 mg | INTRAMUSCULAR | Status: DC | PRN

## 2017-06-16 MED ORDER — SODIUM CHLORIDE 0.9 % IV BOLUS (SEPSIS)
500.0000 mL | Freq: Once | INTRAVENOUS | Status: AC
  Administered 2017-06-16: 500 mL via INTRAVENOUS

## 2017-06-16 MED ORDER — DEXTROSE 50 % IV SOLN
INTRAVENOUS | Status: AC
  Administered 2017-06-16: 22:00:00
  Filled 2017-06-16: qty 50

## 2017-06-16 NOTE — Progress Notes (Signed)
CRITICAL VALUE ALERT  Critical Value: lactate 15.3  Date & Time Notied:  06/16/17 0500  Provider Notified:   Orders Received/Actions taken: MD aware as previously noted

## 2017-06-16 NOTE — Progress Notes (Signed)
 [facility] Progress Note  Subjective: hypotensive overnight and now unresponsive.  DNR.    Vitals:   06/16/17 0400 06/16/17 0436 06/16/17 0500 06/16/17 0800  BP:  (!) 87/50    Pulse: 74 75 73   Resp: (!) 29 (!) 25 (!) 28   Temp:    (!) 96.1 F (35.6 C)  TempSrc:    Axillary  SpO2: 100% 100% 100%   Weight:      Height:        Inpatient medications: . ceFEPime (MAXIPIME) IV  500 mg Intravenous Q24H  . heparin  5,000 Units Subcutaneous Q8H  . insulin aspart  0-9 Units Subcutaneous TID WC  . pantoprazole (PROTONIX) IV  40 mg Intravenous Q24H   . sodium chloride 150 mL/hr at 06/16/17 0300  . sodium chloride 500 mL (06/16/17 0742)   LORazepam, morphine injection, ondansetron (ZOFRAN) IV  Exam: Gen elderly AAF, unresponsive +Kussmaul's respirations  No jvd or bruits Chest clear bilat RRR no MRG Abd soft ntnd no mass or ascites +bs GU defer MS no joint effusions or deformity Ext 1+ RLE edema , no LLE edema / no wounds or ulcers Neuro not responding    Impression: 1.  Renal failure, severe - likely AKI due to sepsis/ hypotension and dehydration. Pt not responding to treatment.  Is now DNR. Pt is not a dialysis candidate.  Will sign off.   2.  Gen weakness/ FTT - multifact 3.  Hx CVA - may 2019, R hemiparesis 4.  DM2 - getting SSI here 5.  HTN - taking 3 BP meds at baseline    Plan - as above   Vinson Moselle MD [facility] pager [phone]   06/16/2017, 8:27 AM    Recent Labs Lab 06/15/17 1123 06/15/17 1813 06/16/17 0239  NA 138 138 139  K 5.0 5.5* 6.2*  6.2*  CL 94* 98* 102  CO2 6* <7* <7*  GLUCOSE 83 126* 118*  BUN 68* 71* 76*  CREATININE 7.74* 7.81* 7.66*  CALCIUM 9.6 9.0 8.3*    Recent Labs Lab 06/15/17 1123 06/16/17 0239  AST 36 60*  ALT <5* 30  ALKPHOS 80 63  BILITOT 1.1 1.1  PROT 7.4 5.5*  ALBUMIN 3.4* 2.8*    Recent Labs Lab 06/15/17 1123 06/15/17 1813 06/16/17 0239  WBC 24.9* 31.5* 38.7*   NEUTROABS 21.0*  --   --   HGB 11.4* 9.3* 8.4*  HCT 36.0 30.5* 27.2*  MCV 90.9 96.5 95.1  PLT 519* 455* 381   Iron/TIBC/Ferritin/ %Sat No results found for: IRON, TIBC, FERRITIN, IRONPCTSAT

## 2017-06-16 NOTE — Progress Notes (Signed)
Nutrition Brief Note  Patient identified on the Malnutrition Screening Tool (MST) Report  Wt Readings from Last 15 Encounters:  06/15/17 123 lb 7.3 oz (56 kg)    Body mass index is 20.54 kg/m. Patient meets criteria for normal weight for height based on current BMI.    Current diet order is NPO. Per chart review pt has been unresponsive to treatment. Spoke with RN regarding pt's status, no known intention on feeding pt at this time.  Labs and medications reviewed.   No nutrition interventions warranted or appropriate at this time.   If nutritional GOC change or status improvement occur, RD to re-evaluate.   If nutrition issues arise, please consult RD.   Fransisca Kaufmann, MS, RDN, LDN 06/16/2017 2:55 PM

## 2017-06-16 NOTE — Progress Notes (Signed)
Krista Smith is admitted with critical illness, including renal failure and suspected PNA. She has apparently been declining rapidly since a stroke a few months ago.   Currently, she is not responding to painful stimuli and BP has been falling despite fluid-resuscitation. She is hypotensive. She is anuric. Brainstem reflexes intact.   I tried reaching her daughter by phone, but it went straight to message. Patient's son, Krista Smith, answered the phone, was aware his mom is here and critically ill, and states that he is the medical decision-maker for the patient.   We discussed the current situation and plan. He reports that "mom definitely wouldn't want any of that" when asked about CPR, intubation, vasopressors. He states that he and his sister already knew that she was likely nearing the end of her life and was not surprised to get my call tonight. He asks that we continue what we are doing to try to get her better, but asks that the patient be DNR, as this would be consistent with the patient's stated wishes. Krista Smith will keep his sister abreast of the situation. He thanked [place] for the call and the care we are providing. He realizes that she very well may not survive the night.

## 2017-06-16 NOTE — Progress Notes (Signed)
Physical Therapy Discharge Patient Details Name: Krista Smith MRN: [id number] DOB: 02/18/1934 Today's Date: 06/16/2017 Time:  -     Patient discharged from PT services secondary to medical decline - will need to re-order PT to resume therapy services.Noted for comfort care. Sepsis symptoms noted.    [facility] [phone]      Rada Hay 06/16/2017, 8:07 AM

## 2017-06-16 NOTE — Progress Notes (Addendum)
VASCULAR LAB PRELIMINARY  PRELIMINARY  PRELIMINARY  PRELIMINARY  Bilateral lower extremity venous duplex completed.    Preliminary report:  Right - Positive for a DVT in the posterior tibial, gastrocnemius, and popliteal veins. There is no evidence of a superficial thrombus or Baker's cyst. Left:  No evidence of DVT, superficial thrombosis, or Baker's cyst.  Johnryan Sao, RVS 06/16/2017, 2:53 PM

## 2017-06-16 NOTE — Progress Notes (Addendum)
Pt blood pressure dropped significantly and notified mD and started a 500 ml fluid bolus per orders. MD showed up and assessed pt and spoke with family. MD entered new orders which include full DNR and state we were not going to do any aggressive treatment and the famuly agreed with this plan. The MD also stated he would monitor labs and he would treat accordingly and that we did not need to inform him of any more critical labs that may arise.

## 2017-06-16 NOTE — Care Management Note (Signed)
Case Management Note  Patient Details  Name: Krista Smith MRN: [id number] Date of Birth: Aug 12, 1934  Subjective/Objective:                  acute renal failure  Action/Plan: Date:  June 16, 2017 Chart reviewed for concurrent status and case management needs. Will continue to follow patient progress. Discharge Planning: following for needs Expected discharge date: [id number] Marcelle Smiling, BSN, [place], [place]   [phone]  Expected Discharge Date:                  Expected Discharge Plan:  Home/Self Care  In-House Referral:     Discharge planning Services  CM Consult  Post Acute Care Choice:    Choice offered to:     DME Arranged:    DME Agency:     HH Arranged:    HH Agency:     Status of Service:  In process, will continue to follow  If discussed at Long Length of Stay Meetings, dates discussed:    Additional Comments:  Golda Acre, RN 06/16/2017, 8:50 AM

## 2017-06-16 NOTE — Progress Notes (Signed)
CRITICAL VALUE ALERT  Critical Value:  Lactic 16.9  Date & Time Notied:  06/16/17 0028  Provider Notified: yes/ paged at 1246  Orders Received/Actions taken: awaiting orders

## 2017-06-16 NOTE — Progress Notes (Signed)
Pt admission documentation is not completed and pt family is not here to assist in its completion.

## 2017-06-16 NOTE — Progress Notes (Signed)
[facility] PROGRESS NOTE    Progress Note  Krista Smith  RUE:[id number] DOB: 1934/08/19 DOA: 06/15/2017 PCP: Patient, No Pcp Per     Brief Narrative:   Krista Smith is an 83 y.o. female past medical history of essential hypertension, diabetes mellitus type 2, history of CVA who is brought to the emergency room for altered mental status.  Assessment/Plan:   Sepsis Shock due to Lobar pneumonia [facility]) Patient has not responded to fluid resuscitation, the overnight physician talked to the family and the family would not want to escalate treatment, they refuse CPR, intubation and vasopressors. The patient mean pressure is less than 61 andand her lactic acid is 16. He is on empiric vancomycin and cefepime, her leukocytosis continues to worsen. I agree with IV morphine for comfort. I tried to call the son and daughter several times and have been unsuccessful. Viviann Spare to discuss with family comfort care measures, as the patient is not responding to treatment.  Toxic encephalopathy Likely due to infectious etiology. She is unresponsive.  AKI (acute kidney injury) (HCC) Likely due to sepsis, with no improvement in renal function despite fluid resuscitation. Will continue IV fluid hydration. Nephrology has been consulted.  Hyperkalemia Likely due to acute renal failure, the patient is not a candidate for dialysis. The family has refused CPR intubation and vasopressors. Renal has been consulted and appreciate assistance.  Melena Of unclear source, her hemoglobin on admission is 9.3 ,Her hemoglobin on admission was 9.3.  Essential hypertension Continue to hold antihypertensive medication.  Type 1 diabetes mellitus without complication (HCC) Blood glucose range a 83-118 continue sliding scale insulin, the patient is nothing by mouth.   DVT prophylaxis: SCD Family Communication:none Disposition Plan/Barrier to D/C: unable to determine Code Status:     Code Status Orders         Start     Ordered   06/16/17 0238  Do not attempt resuscitation (DNR)  Continuous    Question Answer Comment  In the event of cardiac or respiratory ARREST Do not call a "code blue"   In the event of cardiac or respiratory ARREST Do not perform Intubation, CPR, defibrillation or ACLS   In the event of cardiac or respiratory ARREST Use medication by any route, position, wound care, and other measures to relive pain and suffering. May use oxygen, suction and manual treatment of airway obstruction as needed for comfort.      06/16/17 0238    Code Status History    Date Active Date Inactive Code Status Order ID Comments User Context   06/15/2017  2:30 PM 06/16/2017  2:38 AM Full Code [id number]  Eston Esters, MD ED    Advance Directive Documentation     Most Recent Value  Type of Advance Directive  Healthcare Power of Attorney  Pre-existing out of facility DNR order (yellow form or pink MOST form)  -  "MOST" Form in Place?  -        IV Access:    Peripheral IV   Procedures and diagnostic studies:   Dg Chest 2 View  Result Date: 06/15/2017 CLINICAL DATA:  Acute weakness.  CVA in 01/2017. EXAM: CHEST  2 VIEW COMPARISON:  None. FINDINGS: The cardiomediastinal silhouette is unremarkable. Ill-defined 3 cm opacity within the right lower lobe may represent pneumonia. There is no evidence of pulmonary edema, suspicious pulmonary nodule/mass, pleural effusion, or pneumothorax. No acute bony abnormalities are identified. IMPRESSION: Ill-defined right lower lobe opacity which may represent pneumonia. Radiographic follow-up to resolution  recommended. Electronically Signed   By: Harmon Pier M.D.   On: 06/15/2017 12:11   Ct Head Wo Contrast  Result Date: 06/15/2017 CLINICAL DATA:  Worsen altered mental status.  History of stroke. EXAM: CT HEAD WITHOUT CONTRAST TECHNIQUE: Contiguous axial images were obtained from the base of the skull through the vertex without intravenous contrast.  COMPARISON:  None. FINDINGS: Brain: Generalized age related atrophy. The brainstem and cerebellum are unremarkable. There is old infarction in the left basal ganglia and radiating white matter tracts. There is an old left posterior frontal cortical infarction. No sign of acute infarction, intra-axial mass lesion, hemorrhage, hydrocephalus or extra-axial fluid collection. There is a calcified meningioma in the right frontal region measuring 3 cm in diameter. This indents the right frontal lobe slightly but does not appear to be associated with any edema or significant mass effect. There is a 7 mm sessile meningioma in the left frontal region without mass effect. Vascular: There is atherosclerotic calcification of the major vessels at the base of the brain. Skull: Otherwise negative Sinuses/Orbits: Clear/normal Other: None IMPRESSION: No acute finding by CT. Old left basal ganglia and radiating white matter infarctions. Old left frontal cortical and subcortical infarction. Calcified right frontal meningioma measuring 3 cm. Slight indentation of the right frontal lobe, not likely significant. 7 mm sessile meningioma on the left in the frontal region, not significant. Electronically Signed   By: Paulina Fusi M.D.   On: 06/15/2017 15:17   US Renal  Result Date: 06/15/2017 CLINICAL DATA:  Acute kidney injury EXAM: RENAL / URINARY TRACT ULTRASOUND COMPLETE COMPARISON:  None FINDINGS: Right Kidney: Length: 10.2 cm. Echogenicity within normal limits. No mass or hydronephrosis visualized. Left Kidney: Length: 10.3 cm. Echogenicity within normal limits. No mass or hydronephrosis visualized. Bladder: Appears normal for degree of bladder distention. IMPRESSION: 1. Normal renal sonogram. Electronically Signed   By: Signa Kell M.D.   On: 06/15/2017 15:06     Medical Consultants:    None.  Anti-Infectives:   IV vancomycin and cefepime.  Subjective:    Krista Smith unresponsive.  Objective:    Vitals:    06/16/17 0303 06/16/17 0400 06/16/17 0436 06/16/17 0500  BP:   (!) 87/50   Pulse:  74 75 73  Resp:  (!) 29 (!) 25 (!) 28  Temp: (!) 97.5 F (36.4 C)     TempSrc: Oral     SpO2:  100% 100% 100%  Weight:      Height:        Intake/Output Summary (Last 24 hours) at 06/16/17 0714 Last data filed at 06/16/17 0500  Gross per 24 hour  Intake           3412.5 ml  Output               58 ml  Net           3354.5 ml   Filed Weights   06/15/17 1700  Weight: 56 kg (123 lb 7.3 oz)    Exam: General exam: Unresponsive Respiratory system: Good air movement with crackles bilaterally Cardiovascular system: Tachycardic with regular rate and rhythm positive S1-S2 Gastrointestinal system: Abdomen is soft nontender nondistended Central nervous system: Only withdrawing to pain. Extremities: No pedal edema. Skin: No rashes, lesions or ulcers   Data Reviewed:    Labs: Basic Metabolic Panel:  Recent Labs Lab 06/15/17 1123 06/15/17 1813 06/16/17 0239  NA 138 138 139  K 5.0 5.5* 6.2*  6.2*  CL 94* 98*  102  CO2 6* <7* <7*  GLUCOSE 83 126* 118*  BUN 68* 71* 76*  CREATININE 7.74* 7.81* 7.66*  CALCIUM 9.6 9.0 8.3*   GFR Estimated Creatinine Clearance: 5.2 mL/min (A) (by C-G formula based on SCr of 7.66 mg/dL (H)). Liver Function Tests:  Recent Labs Lab 06/15/17 1123 06/16/17 0239  AST 36 60*  ALT <5* 30  ALKPHOS 80 63  BILITOT 1.1 1.1  PROT 7.4 5.5*  ALBUMIN 3.4* 2.8*   No results for input(s): LIPASE, AMYLASE in the last 168 hours. No results for input(s): AMMONIA in the last 168 hours. Coagulation profile No results for input(s): INR, PROTIME in the last 168 hours.  CBC:  Recent Labs Lab 06/15/17 1123 06/15/17 1813 06/16/17 0239  WBC 24.9* 31.5* 38.7*  NEUTROABS 21.0*  --   --   HGB 11.4* 9.3* 8.4*  HCT 36.0 30.5* 27.2*  MCV 90.9 96.5 95.1  PLT 519* 455* 381   Cardiac Enzymes:  Recent Labs Lab 06/15/17 1123  TROPONINI 0.03*   BNP (last 3 results) No  results for input(s): PROBNP in the last 8760 hours. CBG:  Recent Labs Lab 06/15/17 2129  GLUCAP 120*   D-Dimer: No results for input(s): DDIMER in the last 72 hours. Hgb A1c: No results for input(s): HGBA1C in the last 72 hours. Lipid Profile: No results for input(s): CHOL, HDL, LDLCALC, TRIG, CHOLHDL, LDLDIRECT in the last 72 hours. Thyroid function studies: No results for input(s): TSH, T4TOTAL, T3FREE, THYROIDAB in the last 72 hours.  Invalid input(s): FREET3 Anemia work up: No results for input(s): VITAMINB12, FOLATE, FERRITIN, TIBC, IRON, RETICCTPCT in the last 72 hours. Sepsis Labs:  Recent Labs Lab 06/15/17 1123 06/15/17 1813 06/15/17 2322 06/16/17 0239  WBC 24.9* 31.5*  --  38.7*  LATICACIDVEN  --  18.6* 16.9* 15.3*   Microbiology Recent Results (from the past 240 hour(s))  MRSA PCR Screening     Status: None   Collection Time: 06/15/17  4:59 PM  Result Value Ref Range Status   MRSA by PCR NEGATIVE NEGATIVE Final    Comment:        The GeneXpert MRSA Assay (FDA approved for NASAL specimens only), is one component of a comprehensive MRSA colonization surveillance program. It is not intended to diagnose MRSA infection nor to guide or monitor treatment for MRSA infections.      Medications:   . ceFEPime (MAXIPIME) IV  500 mg Intravenous Q24H  . heparin  5,000 Units Subcutaneous Q8H  . insulin aspart  0-9 Units Subcutaneous TID WC  . pantoprazole (PROTONIX) IV  40 mg Intravenous Q24H   Continuous Infusions: . sodium chloride 150 mL/hr at 06/16/17 0300     LOS: 1 day   Marinda Elk  [facility] Pager [phone]  *Please refer to [email or web address], password [email or web address] to get updated schedule on who will round on this patient, as hospitalists switch teams weekly. If 7PM-7AM, please contact night-coverage at [email or web address], password [email or web address] for any overnight needs.  06/16/2017, 7:14 AM

## 2017-06-16 NOTE — Progress Notes (Signed)
D/w Dr. Feliz.  He has had a chance to evaluate patient and no need for formal palliative consult at this time.  As always, we remain available as needed.  Please call or re-consult palliative if we can be of assistance.  Gussie Murton, MD [facility] [phone]  NO CHARGE NOTE 

## 2017-06-17 DIAGNOSIS — J13 Pneumonia due to Streptococcus pneumoniae: Secondary | ICD-10-CM

## 2017-06-17 DIAGNOSIS — J181 Lobar pneumonia, unspecified organism: Secondary | ICD-10-CM

## 2017-06-17 DIAGNOSIS — K921 Melena: Secondary | ICD-10-CM

## 2017-06-17 DIAGNOSIS — I82409 Acute embolism and thrombosis of unspecified deep veins of unspecified lower extremity: Secondary | ICD-10-CM

## 2017-06-17 LAB — KAPPA/LAMBDA LIGHT CHAINS
KAPPA FREE LGHT CHN: 111.8 mg/L — AB (ref 3.3–19.4)
KAPPA, LAMDA LIGHT CHAIN RATIO: 1.92 — AB (ref 0.26–1.65)
LAMDA FREE LIGHT CHAINS: 58.3 mg/L — AB (ref 5.7–26.3)

## 2017-06-17 LAB — CREATININE, SERUM
CREATININE: 7.14 mg/dL — AB (ref 0.44–1.00)
GFR calc Af Amer: 6 mL/min — ABNORMAL LOW (ref >60)
GFR, EST NON AFRICAN AMERICAN: 5 mL/min — AB (ref >60)

## 2017-06-17 LAB — C4 COMPLEMENT: Complement C4, Body Fluid: 18 mg/dL (ref 14–44)

## 2017-06-17 LAB — C3 COMPLEMENT: C3 Complement: 86 mg/dL (ref 82–167)

## 2017-06-17 MED ORDER — LORAZEPAM 2 MG/ML IJ SOLN
0.5000 mg | Freq: Once | INTRAMUSCULAR | Status: AC
  Administered 2017-06-17: 0.5 mg via INTRAVENOUS
  Filled 2017-06-17: qty 1

## 2017-06-17 MED ORDER — MORPHINE SULFATE (PF) 4 MG/ML IV SOLN
1.0000 mg | INTRAVENOUS | Status: DC
  Administered 2017-06-17 (×2): 2 mg via INTRAVENOUS
  Administered 2017-06-17: 1 mg via INTRAVENOUS
  Administered 2017-06-17 (×5): 2 mg via INTRAVENOUS
  Administered 2017-06-17: 1 mg via INTRAVENOUS
  Administered 2017-06-18 (×3): 2 mg via INTRAVENOUS
  Administered 2017-06-18 (×3): 1 mg via INTRAVENOUS
  Administered 2017-06-18: 2 mg via INTRAVENOUS
  Administered 2017-06-18: 1 mg via INTRAVENOUS
  Administered 2017-06-18: 2 mg via INTRAVENOUS
  Filled 2017-06-17 (×18): qty 1

## 2017-06-17 NOTE — Progress Notes (Addendum)
[facility] PROGRESS NOTE    Progress Note  Krista Smith  ZOX:[id number] DOB: 08/17/1934 DOA: 06/15/2017 PCP: Patient, No Pcp Per     Brief Narrative:   Krista Smith is an 83 y.o. female past medical history of essential hypertension, diabetes mellitus type 2, history of CVA who is brought to the emergency room for altered mental status.  Assessment/Plan:   Sepsis Shock due to Lobar pneumonia [facility]) Patient has not responded to fluid resuscitation, the overnight physician talked to the family and the family would not want to escalate treatment, they refuse CPR, intubation and vasopressors. Her creatinine continues to worsen her hyperkalemia continues to rise. I have talked to the son and daughter they both agreed to move towards comfort care. They have agreed to discontinue labs and antibiotics, They have agreed to start her on morphine scheduled, will consult social worker for residential. I spent more than 35 minutes speaking with the family about comfort care. I will talk among themselves to see day would weather place at a residential hospice facility.  Toxic encephalopathy Likely due to infectious etiology. She is unresponsive.  AKI (acute kidney injury) (HCC) Likely due to sepsis, with no improvement in renal function despite fluid resuscitation.  I appreciate renal's assistance, discontinue IV fluids as per discussion with family.  Hyperkalemia Likely due to acute renal failure, the patient is not a candidate for dialysis. Stop checking labs.  Melena Of unclear source, her hemoglobin on admission is 9.3 ,Her hemoglobin on admission was 9.3.  Essential hypertension Continue to hold antihypertensive medication.  Type 1 diabetes mellitus without complication (HCC) Blood glucose range a 83-118 continue sliding scale insulin, the patient is nothing by mouth.  Acute DVT: She has multiple co morbidities including but not limited to unresponsive sepsis due to PNA and  melena. The family has decided to move toward comfort care.  DVT prophylaxis: SCD Family Communication:none Disposition Plan/Barrier to D/C: Transfer to med surg Code Status:     Code Status Orders        Start     Ordered   06/16/17 0238  Do not attempt resuscitation (DNR)  Continuous    Question Answer Comment  In the event of cardiac or respiratory ARREST Do not call a "code blue"   In the event of cardiac or respiratory ARREST Do not perform Intubation, CPR, defibrillation or ACLS   In the event of cardiac or respiratory ARREST Use medication by any route, position, wound care, and other measures to relive pain and suffering. May use oxygen, suction and manual treatment of airway obstruction as needed for comfort.      06/16/17 0238    Code Status History    Date Active Date Inactive Code Status Order ID Comments User Context   06/15/2017  2:30 PM 06/16/2017  2:38 AM Full Code [id number]  Eston Esters, MD ED    Advance Directive Documentation     Most Recent Value  Type of Advance Directive  Healthcare Power of Attorney  Pre-existing out of facility DNR order (yellow form or pink MOST form)  -  "MOST" Form in Place?  -        IV Access:    Peripheral IV   Procedures and diagnostic studies:   Dg Chest 2 View  Result Date: 06/15/2017 CLINICAL DATA:  Acute weakness.  CVA in 01/2017. EXAM: CHEST  2 VIEW COMPARISON:  None. FINDINGS: The cardiomediastinal silhouette is unremarkable. Ill-defined 3 cm opacity within the right lower lobe may  represent pneumonia. There is no evidence of pulmonary edema, suspicious pulmonary nodule/mass, pleural effusion, or pneumothorax. No acute bony abnormalities are identified. IMPRESSION: Ill-defined right lower lobe opacity which may represent pneumonia. Radiographic follow-up to resolution recommended. Electronically Signed   By: Harmon Pier M.D.   On: 06/15/2017 12:11   Ct Head Wo Contrast  Result Date: 06/15/2017 CLINICAL DATA:   Worsen altered mental status.  History of stroke. EXAM: CT HEAD WITHOUT CONTRAST TECHNIQUE: Contiguous axial images were obtained from the base of the skull through the vertex without intravenous contrast. COMPARISON:  None. FINDINGS: Brain: Generalized age related atrophy. The brainstem and cerebellum are unremarkable. There is old infarction in the left basal ganglia and radiating white matter tracts. There is an old left posterior frontal cortical infarction. No sign of acute infarction, intra-axial mass lesion, hemorrhage, hydrocephalus or extra-axial fluid collection. There is a calcified meningioma in the right frontal region measuring 3 cm in diameter. This indents the right frontal lobe slightly but does not appear to be associated with any edema or significant mass effect. There is a 7 mm sessile meningioma in the left frontal region without mass effect. Vascular: There is atherosclerotic calcification of the major vessels at the base of the brain. Skull: Otherwise negative Sinuses/Orbits: Clear/normal Other: None IMPRESSION: No acute finding by CT. Old left basal ganglia and radiating white matter infarctions. Old left frontal cortical and subcortical infarction. Calcified right frontal meningioma measuring 3 cm. Slight indentation of the right frontal lobe, not likely significant. 7 mm sessile meningioma on the left in the frontal region, not significant. Electronically Signed   By: Paulina Fusi M.D.   On: 06/15/2017 15:17   US Renal  Result Date: 06/15/2017 CLINICAL DATA:  Acute kidney injury EXAM: RENAL / URINARY TRACT ULTRASOUND COMPLETE COMPARISON:  None FINDINGS: Right Kidney: Length: 10.2 cm. Echogenicity within normal limits. No mass or hydronephrosis visualized. Left Kidney: Length: 10.3 cm. Echogenicity within normal limits. No mass or hydronephrosis visualized. Bladder: Appears normal for degree of bladder distention. IMPRESSION: 1. Normal renal sonogram. Electronically Signed   By: Signa Kell M.D.   On: 06/15/2017 15:06     Medical Consultants:    None.  Anti-Infectives:   IV vancomycin and cefepime.  Subjective:    Krista Smith unresponsive.  Objective:    Vitals:   06/17/17 0000 06/17/17 0030 06/17/17 0300 06/17/17 0600  BP:  109/61  (!) 107/53  Pulse: 74 76  65  Resp: (!) 24 (!) 23  20  Temp:   (!) 96.3 F (35.7 C)   TempSrc:   Axillary   SpO2: 100% 100%  99%  Weight:      Height:        Intake/Output Summary (Last 24 hours) at 06/17/17 0752 Last data filed at 06/17/17 0600  Gross per 24 hour  Intake             3800 ml  Output              420 ml  Net             3380 ml   Filed Weights   06/15/17 1700  Weight: 56 kg (123 lb 7.3 oz)    Exam: General exam: Unresponsive Respiratory system: Good air movement with crackles bilaterally Cardiovascular system: Tachycardic with regular rate and rhythm positive S1-S2 Gastrointestinal system: Abdomen is soft nontender nondistended Central nervous system: Only withdrawing to pain. Extremities: No pedal edema. Skin: No rashes, lesions or  ulcers   Data Reviewed:    Labs: Basic Metabolic Panel:  Recent Labs Lab 06/15/17 1123 06/15/17 1813 06/16/17 0239 06/17/17 0336  NA 138 138 139  --   K 5.0 5.5* 6.2*  6.2*  --   CL 94* 98* 102  --   CO2 <7* <7* <7*  --   GLUCOSE 83 126* 118*  --   BUN 68* 71* 76*  --   CREATININE 7.74* 7.81* 7.66* 7.14*  CALCIUM 9.6 9.0 8.3*  --    GFR Estimated Creatinine Clearance: 5.6 mL/min (A) (by C-G formula based on SCr of 7.14 mg/dL (H)). Liver Function Tests:  Recent Labs Lab 06/15/17 1123 06/16/17 0239  AST 36 60*  ALT <5* 30  ALKPHOS 80 63  BILITOT 1.1 1.1  PROT 7.4 5.5*  ALBUMIN 3.4* 2.8*   No results for input(s): LIPASE, AMYLASE in the last 168 hours. No results for input(s): AMMONIA in the last 168 hours. Coagulation profile No results for input(s): INR, PROTIME in the last 168 hours.  CBC:  Recent Labs Lab 06/15/17 1123  06/15/17 1813 06/16/17 0239  WBC 24.9* 31.5* 38.7*  NEUTROABS 21.0*  --   --   HGB 11.4* 9.3* 8.4*  HCT 36.0 30.5* 27.2*  MCV 90.9 96.5 95.1  PLT 519* 455* 381   Cardiac Enzymes:  Recent Labs Lab 06/15/17 1123  TROPONINI 0.03*   BNP (last 3 results) No results for input(s): PROBNP in the last 8760 hours. CBG:  Recent Labs Lab 06/16/17 0722 06/16/17 1217 06/16/17 1703 06/16/17 2133 06/16/17 2212  GLUCAP 127* 100* 89 65 164*   D-Dimer: No results for input(s): DDIMER in the last 72 hours. Hgb A1c:  Recent Labs  06/16/17 0239  HGBA1C 6.8*   Lipid Profile: No results for input(s): CHOL, HDL, LDLCALC, TRIG, CHOLHDL, LDLDIRECT in the last 72 hours. Thyroid function studies: No results for input(s): TSH, T4TOTAL, T3FREE, THYROIDAB in the last 72 hours.  Invalid input(s): FREET3 Anemia work up: No results for input(s): VITAMINB12, FOLATE, FERRITIN, TIBC, IRON, RETICCTPCT in the last 72 hours. Sepsis Labs:  Recent Labs Lab 06/15/17 1123 06/15/17 1813 06/15/17 2322 06/16/17 0239  WBC 24.9* 31.5*  --  38.7*  LATICACIDVEN  --  18.6* 16.9* 15.3*   Microbiology Recent Results (from the past 240 hour(s))  Culture, blood (routine x 2)     Status: None (Preliminary result)   Collection Time: 06/15/17 11:12 AM  Result Value Ref Range Status   Specimen Description BLOOD LEFT ANTECUBITAL  Final   Special Requests   Final    BOTTLES DRAWN AEROBIC AND ANAEROBIC Blood Culture adequate volume   Culture   Final    NO GROWTH < 24 HOURS Performed at [facility], [address], [place], [place] [address]    Report Status PENDING  Incomplete  MRSA PCR Screening     Status: None   Collection Time: 06/15/17  4:59 PM  Result Value Ref Range Status   MRSA by PCR NEGATIVE NEGATIVE Final    Comment:        The GeneXpert MRSA Assay (FDA approved for NASAL specimens only), is one component of a comprehensive MRSA colonization surveillance program. It is  not intended to diagnose MRSA infection nor to guide or monitor treatment for MRSA infections.   Culture, blood (routine x 2)     Status: None (Preliminary result)   Collection Time: 06/15/17  5:11 PM  Result Value Ref Range Status   Specimen  Description BLOOD RIGHT ARM  Final   Special Requests IN PEDIATRIC BOTTLE Blood Culture adequate volume  Final   Culture   Final    NO GROWTH < 24 HOURS Performed at [facility], [address] [place], [place] [address]    Report Status PENDING  Incomplete     Medications:   . LORazepam  0.5 mg Intravenous Once  .  morphine injection  1-2 mg Intravenous Q2H   Continuous Infusions:    LOS: 2 days   Marinda Elk  [facility] Pager [phone]  *Please refer to [email or web address], password [email or web address] to get updated schedule on who will round on this patient, as hospitalists switch teams weekly. If 7PM-7AM, please contact night-coverage at [email or web address], password [email or web address] for any overnight needs.  06/17/2017, 7:52 AM

## 2017-06-17 NOTE — Progress Notes (Addendum)
CSW consulted to assist with dc planning. Attending contacted to discuss dc plan. MD spoke with both son/ daughter today. Family plans to meet in pt's room around noon today to discuss possible residential hospice home placement. CSW will offer residential hospice choice, if this is the direction family chooses.   Cori Razor LCSW [phone]  13:28 : CSW went to pt's room hoping to meet with family. NSG reports no family present today. NSG will alert CSW once family arrives.   Cori Razor LCSW [phone]

## 2017-06-18 DIAGNOSIS — J189 Pneumonia, unspecified organism: Secondary | ICD-10-CM

## 2017-06-18 DIAGNOSIS — N179 Acute kidney failure, unspecified: Secondary | ICD-10-CM

## 2017-06-18 DIAGNOSIS — I10 Essential (primary) hypertension: Secondary | ICD-10-CM

## 2017-06-18 DIAGNOSIS — I82431 Acute embolism and thrombosis of right popliteal vein: Secondary | ICD-10-CM

## 2017-06-18 LAB — PROTEIN ELECTROPHORESIS, SERUM
A/G Ratio: 1 (ref 0.7–1.7)
ALBUMIN ELP: 2.7 g/dL — AB (ref 2.9–4.4)
ALPHA-1-GLOBULIN: 0.2 g/dL (ref 0.0–0.4)
ALPHA-2-GLOBULIN: 0.6 g/dL (ref 0.4–1.0)
BETA GLOBULIN: 0.8 g/dL (ref 0.7–1.3)
GAMMA GLOBULIN: 1 g/dL (ref 0.4–1.8)
Globulin, Total: 2.6 g/dL (ref 2.2–3.9)
Total Protein ELP: 5.3 g/dL — ABNORMAL LOW (ref 6.0–8.5)

## 2017-06-18 MED ORDER — MORPHINE SULFATE 20 MG/5ML PO SOLN: 10.0000 mg | ORAL | 0 refills | Status: AC | PRN

## 2017-06-18 MED ORDER — LORAZEPAM 2 MG/ML PO CONC: 1.0000 mg | Freq: Four times a day (QID) | ORAL | 0 refills | Status: AC | PRN

## 2017-06-18 NOTE — Discharge Summary (Signed)
Physician Discharge Summary  Aliviyah Malanga ZOX:[id number] DOB: 1934-01-26 DOA: 06/15/2017  PCP: Patient, No Pcp Per  Admit date: 06/15/2017 Discharge date: 06/18/2017  Admitted From: home Disposition:  Home with hospice for EOL care  Recommendations for Outpatient Follow-up:  Follow up with hospice  Home Health: hospice Equipment/Devices: none   Discharge Condition: guarded CODE STATUS: DNR Diet recommendation: comfort  HPI: Per Dr. Mickle Mallory, Pt is a 83 yo female with hx of HTN, DMII, CVA who was brought with cc of AMS and weakness. Pt had CVA in may s/p rehab in [place] then her daughter had her moved to here to help her out ( daughter is a PT) as she was not happy with her physical therapy progress. The patient has not been doing well and continued to have weakness and poor appetite and lost 50 lbs over 5 months. Three days ago she started having AMS/being confused and today she had several episodes of vomiting of thick green mucus and had tarry stool but did not complaint of pain/dysuria and did not have fever/chills/chest pain/dyspnea. Pt is unable to give history.   Hospital Course: Discharge Diagnoses:  Active Problems:   Sepsis (HCC)   Toxic encephalopathy   AKI (acute kidney injury) (HCC)   Hyperkalemia   Melena   Essential hypertension   Type 1 diabetes mellitus without complication (HCC)   Lobar pneumonia (HCC)   Acute DVT (deep venous thrombosis) (HCC)  Septic Shock due to Lobar pneumonia [facility]) Patient has not responded to fluid resuscitation, Dr. Antionette Char talked to the family and the family would not want to escalate treatment, they refuse CPR, intubation and vasopressors. Her creatinine continues to worsen her hyperkalemia continues to rise. I have talked to the son and daughter they both agreed to move towards comfort care. They have agreed to discontinue labs and antibiotics, They have agreed to start her on morphine scheduled, and would like patient to be home  with hospice  Toxic encephalopathy Likely due to infectious etiology. She is unresponsive.  AKI (acute kidney injury) (HCC) Likely due to sepsis, with no improvement in renal function despite fluid resuscitation.  I appreciate renal's assistance, discontinue IV fluids as per discussion with family.  Hyperkalemia Likely due to acute renal failure, the patient is not a candidate for dialysis.  Melena Of unclear source, her hemoglobin on admission is 9.3 ,Her hemoglobin on admission was 9.3.  Essential hypertension Continue to hold antihypertensive medication.  Type 1 diabetes mellitus without complication (HCC) Blood glucose range a 83-118 continue sliding scale insulin, the patient is nothing by mouth.  Acute DVT: She has multiple co morbidities including but not limited to unresponsive sepsis due to PNA and melena. The family has decided to move toward comfort care.   Discharge Instructions   Allergies as of 06/18/2017   No Known Allergies     Medication List    STOP taking these medications   ASPIRIN LOW DOSE 81 MG chewable tablet Generic drug:  aspirin   atenolol-chlorthalidone 100-25 MG tablet Commonly known as:  TENORETIC   LANTUS SOLOSTAR 100 UNIT/ML Solostar Pen Generic drug:  Insulin Glargine   metFORMIN 850 MG tablet Commonly known as:  GLUCOPHAGE   NIFEdipine 60 MG 24 hr tablet Commonly known as:  PROCARDIA XL/ADALAT-CC     TAKE these medications   LORazepam 2 MG/ML concentrated solution Commonly known as:  ATIVAN Take 0.5 mLs (1 mg total) by mouth every 6 (six) hours as needed for anxiety.   morphine 20  MG/5ML solution Take 2.5 mLs (10 mg total) by mouth every 2 (two) hours as needed for pain.            Discharge Care Instructions        Start     Ordered   06/18/17 0000  morphine 20 MG/5ML solution  Every 2 hours PRN     06/18/17 1358   06/18/17 0000  LORazepam (ATIVAN) 2 MG/ML concentrated solution  Every 6 hours PRN      06/18/17 1358      No Known Allergies  Consultations:  Nephrology   Procedures/Studies:  Dg Chest 2 View  Result Date: 06/15/2017 CLINICAL DATA:  Acute weakness.  CVA in 01/2017. EXAM: CHEST  2 VIEW COMPARISON:  None. FINDINGS: The cardiomediastinal silhouette is unremarkable. Ill-defined 3 cm opacity within the right lower lobe may represent pneumonia. There is no evidence of pulmonary edema, suspicious pulmonary nodule/mass, pleural effusion, or pneumothorax. No acute bony abnormalities are identified. IMPRESSION: Ill-defined right lower lobe opacity which may represent pneumonia. Radiographic follow-up to resolution recommended. Electronically Signed   By: Harmon Pier M.D.   On: 06/15/2017 12:11   Ct Head Wo Contrast  Result Date: 06/15/2017 CLINICAL DATA:  Worsen altered mental status.  History of stroke. EXAM: CT HEAD WITHOUT CONTRAST TECHNIQUE: Contiguous axial images were obtained from the base of the skull through the vertex without intravenous contrast. COMPARISON:  None. FINDINGS: Brain: Generalized age related atrophy. The brainstem and cerebellum are unremarkable. There is old infarction in the left basal ganglia and radiating white matter tracts. There is an old left posterior frontal cortical infarction. No sign of acute infarction, intra-axial mass lesion, hemorrhage, hydrocephalus or extra-axial fluid collection. There is a calcified meningioma in the right frontal region measuring 3 cm in diameter. This indents the right frontal lobe slightly but does not appear to be associated with any edema or significant mass effect. There is a 7 mm sessile meningioma in the left frontal region without mass effect. Vascular: There is atherosclerotic calcification of the major vessels at the base of the brain. Skull: Otherwise negative Sinuses/Orbits: Clear/normal Other: None IMPRESSION: No acute finding by CT. Old left basal ganglia and radiating white matter infarctions. Old left frontal  cortical and subcortical infarction. Calcified right frontal meningioma measuring 3 cm. Slight indentation of the right frontal lobe, not likely significant. 7 mm sessile meningioma on the left in the frontal region, not significant. Electronically Signed   By: Paulina Fusi M.D.   On: 06/15/2017 15:17   US Renal  Result Date: 06/15/2017 CLINICAL DATA:  Acute kidney injury EXAM: RENAL / URINARY TRACT ULTRASOUND COMPLETE COMPARISON:  None FINDINGS: Right Kidney: Length: 10.2 cm. Echogenicity within normal limits. No mass or hydronephrosis visualized. Left Kidney: Length: 10.3 cm. Echogenicity within normal limits. No mass or hydronephrosis visualized. Bladder: Appears normal for degree of bladder distention. IMPRESSION: 1. Normal renal sonogram. Electronically Signed   By: Signa Kell M.D.   On: 06/15/2017 15:06      Subjective: unresponsive  Discharge Exam: Vitals:   06/17/17 1950 06/18/17 0800  BP: (!) 101/58 (!) 91/56  Pulse: 80 72  Resp: 17 17  Temp:    SpO2: 96% 92%    General: NAD, shallow breathing   The results of significant diagnostics from this hospitalization (including imaging, microbiology, ancillary and laboratory) are listed below for reference.     Microbiology: Recent Results (from the past 240 hour(s))  Culture, blood (routine x 2)  Status: None (Preliminary result)   Collection Time: 06/15/17 11:12 AM  Result Value Ref Range Status   Specimen Description BLOOD LEFT ANTECUBITAL  Final   Special Requests   Final    BOTTLES DRAWN AEROBIC AND ANAEROBIC Blood Culture adequate volume   Culture   Final    NO GROWTH 2 DAYS Performed at [facility], [address] [place], [place] [address]    Report Status PENDING  Incomplete  MRSA PCR Screening     Status: None   Collection Time: 06/15/17  4:59 PM  Result Value Ref Range Status   MRSA by PCR NEGATIVE NEGATIVE Final    Comment:        The GeneXpert MRSA Assay (FDA approved for NASAL  specimens only), is one component of a comprehensive MRSA colonization surveillance program. It is not intended to diagnose MRSA infection nor to guide or monitor treatment for MRSA infections.   Culture, blood (routine x 2)     Status: None (Preliminary result)   Collection Time: 06/15/17  5:11 PM  Result Value Ref Range Status   Specimen Description BLOOD RIGHT ARM  Final   Special Requests IN PEDIATRIC BOTTLE Blood Culture adequate volume  Final   Culture   Final    NO GROWTH 2 DAYS Performed at [facility], [address] [place], [place] [address]    Report Status PENDING  Incomplete     Labs: BNP (last 3 results) No results for input(s): BNP in the last 8760 hours. Basic Metabolic Panel:  Recent Labs Lab 06/15/17 1123 06/15/17 1813 06/16/17 0239 06/17/17 0336  NA 138 138 139  --   K 5.0 5.5* 6.2*  6.2*  --   CL 94* 98* 102  --   CO2 <7* <7* <7*  --   GLUCOSE 83 126* 118*  --   BUN 68* 71* 76*  --   CREATININE 7.74* 7.81* 7.66* 7.14*  CALCIUM 9.6 9.0 8.3*  --    Liver Function Tests:  Recent Labs Lab 06/15/17 1123 06/16/17 0239  AST 36 60*  ALT <5* 30  ALKPHOS 80 63  BILITOT 1.1 1.1  PROT 7.4 5.5*  ALBUMIN 3.4* 2.8*   No results for input(s): LIPASE, AMYLASE in the last 168 hours. No results for input(s): AMMONIA in the last 168 hours. CBC:  Recent Labs Lab 06/15/17 1123 06/15/17 1813 06/16/17 0239  WBC 24.9* 31.5* 38.7*  NEUTROABS 21.0*  --   --   HGB 11.4* 9.3* 8.4*  HCT 36.0 30.5* 27.2*  MCV 90.9 96.5 95.1  PLT 519* 455* 381   Cardiac Enzymes:  Recent Labs Lab 06/15/17 1123  TROPONINI 0.03*   BNP: Invalid input(s): POCBNP CBG:  Recent Labs Lab 06/16/17 0722 06/16/17 1217 06/16/17 1703 06/16/17 2133 06/16/17 2212  GLUCAP 127* 100* 89 65 164*   D-Dimer No results for input(s): DDIMER in the last 72 hours. Hgb A1c  Recent Labs  06/16/17 0239  HGBA1C 6.8*   Lipid Profile No results for input(s): CHOL,  HDL, LDLCALC, TRIG, CHOLHDL, LDLDIRECT in the last 72 hours. Thyroid function studies No results for input(s): TSH, T4TOTAL, T3FREE, THYROIDAB in the last 72 hours.  Invalid input(s): FREET3 Anemia work up No results for input(s): VITAMINB12, FOLATE, FERRITIN, TIBC, IRON, RETICCTPCT in the last 72 hours. Urinalysis    Component Value Date/Time   COLORURINE YELLOW 06/15/2017 1400   APPEARANCEUR HAZY (A) 06/15/2017 1400   LABSPEC 1.009 06/15/2017 1400   PHURINE  6.0 06/15/2017 1400   GLUCOSEU 50 (A) 06/15/2017 1400   HGBUR SMALL (A) 06/15/2017 1400   BILIRUBINUR NEGATIVE 06/15/2017 1400   KETONESUR 20 (A) 06/15/2017 1400   PROTEINUR 100 (A) 06/15/2017 1400   NITRITE NEGATIVE 06/15/2017 1400   LEUKOCYTESUR MODERATE (A) 06/15/2017 1400   Sepsis Labs Invalid input(s): PROCALCITONIN,  WBC,  LACTICIDVEN   Time coordinating discharge: 25 minutes  SIGNED:  Pamella Pert, MD  [facility] 06/18/2017, 1:58 PM Pager [phone]  If 7PM-7AM, please contact night-coverage [email or web address] Password [email or web address]

## 2017-06-18 NOTE — Clinical Social Work Note (Signed)
Clinical Social Work Assessment  Patient Details  Name: Krista Smith MRN: [id number] Date of Birth: 12/07/1933  Date of referral:  06/18/17               Reason for consult:  End of Life/Hospice                Permission sought to share information with:  Family Supports Permission granted to share information::     Name::     daughter Monique, son Michael  Agency::     Relationship::     Contact Information:     Housing/Transportation Living arrangements for the past 2 months:  Single Family Home Source of Information:  Adult Children, Medical Team Patient Interpreter Needed:  None Criminal Activity/Legal Involvement Pertinent to Current Situation/Hospitalization:  No - Comment as needed Significant Relationships:  Adult Children Lives with:  Adult Children Do you feel safe going back to the place where you live?  Yes Need for family participation in patient care:  Yes (Comment) (family highly involved in care decisions)  Care giving concerns:  Pt from home where she resides with her daughter. Daughter reports family has "planned for years to take care of their mother in her old age." Hospitalized for sepsis and as of yesterday family agreed upon comfort care measures.    Social Worker assessment / plan:  CSW consulted for assistance with transition to hospice care. Both residential hospice and home with hospice have been discussed with family per chart.  Met with pt and her daughter- pt sleeping. Daughter states pt has 3 children "and we decide everything together but my brother is the main decision maker. He will be here later and we will finalize our plans, but we know we want to take her home." [facility] aware of pt- have met with daughter but awaiting formal referral once pt's family solidifies their plans. CSW will remain available for assistance/support. If needed, will refer to residential hospice, however family opting to have pt return home at this time.   Employment status:   Retired Insurance information:  Medicare PT Recommendations:  Not assessed at this time Information / Referral to community resources:     Patient/Family's Response to care:  Pt sleeping. Daughter appreciative of care but upset as appropriate to end-of-life situation for pt  Patient/Family's Understanding of and Emotional Response to Diagnosis, Current Treatment, and Prognosis:  Pt sleeping. Daughter demonstrates thorough understanding of pt's situation- treatment leading up to this and reasons it has been unsuccessful. Daughter expresses, "We are angry, we wish the infection could've been caught sooner so that maybe she would've had a better chance." Nonetheless, daughter states, "We are a strong family. Mom has people waiting on her in heaven and we will celebrate her leaving this earth to move on to a better place. We will get through this with each other."  Emotional Assessment Appearance:  Appears stated age Attitude/Demeanor/Rapport:   (sleeping) Affect (typically observed):  Calm Orientation:   (UTA) Alcohol / Substance use:  Not Applicable Psych involvement (Current and /or in the community):  No (Comment)  Discharge Needs  Concerns to be addressed:  Other (Comment Required (end of life plans) Readmission within the last 30 days:  No Current discharge risk:  None, Terminally ill Barriers to Discharge:  No Barriers Identified   Meghan R Stout, LCSW 06/18/2017, 11:16 AM  [phone]  

## 2017-06-18 NOTE — Progress Notes (Addendum)
Date: June 18, 2017 Spoke the daughter on the phone still trying to decide if home hospice is feasible will talk with family and call me back/home hospice [facility] be [facility] and palliative care. Spoke with Sherene Sires who is one a the daughters-will wants home hospice/tct-Mary Thurston Hole with [facility]/ will come and see patient and family. 1030/tcf-Amy Evans family is wanting home hospice but are awaiting the arrival of the brother that is coming from [place]. He is the actual poa. Clide Dales, [phone]

## 2017-06-18 NOTE — Progress Notes (Signed)
This CM spoke with son Casimiro Needle at pt bedside to confirm family desire for home hospice with [facility]. Casimiro Needle states that pt will be going home with his sister Gabriel Rung, but he and his other sister will assist with care. Monique requests a hospital bed for home. Amy from [facility] called for referral for home hospice and request made for hospital bed. Monique to contact staff RN when hospital bed has been delivered and RN to call [facility] for transport home. Sandford Craze RN,BSN,NCM [phone]

## 2017-06-18 NOTE — Progress Notes (Signed)
[facility]) Hospital Liaison:  RN visit  Notified by Arna Medici [facility], of patient/family request for [facility] services at home after discharge.  Chart and patient information under review by [facility] physician.  Hospice eligibility pending at this time.   Write spoke with Zella Ball, daughter, Casimiro Needle, son at bedside and Gabriel Rung, daughter over the phone to initiate education related to hospice philosophy, services and team approach to care.  Patient/family verbalized understanding of information given.  Per discussion, plan is for discharge to home by [facility] 06/18/17, once hospital bed has been delivered to the home.  Please send signed and completed DNR form home with patient/family.  Patient will need prescriptions for discharge comfort medications.  DME needs have been discussed, patient currently has a 4 prong cane in the home.  Family requests hospital bed to be delivered to the home.  They have declined any additional equipment at this time.  [facility] equipment manager has been notified and will contact [facility] to arrange delivery to the home.  Home address has been verified and is incorrect in the chart.  Patient address will be [address], [place], [place] [address].  Gabriel Rung, daughter, is the family member to contact to arrange time of delivery at [phone].  [facility]erral [facility] aware of the above.  Completed discharge summary will need to be faxed to [facility] at [phone] when final.  Please notify [facility] when patient is ready to leave the unit at discharge.  Call [phone] or [phone] after 5pm.  [facility] information and contact numbers given to Zella Ball and Casimiro Needle during this visit.  Above information shared with Arna Medici [facility].  Please call with any hospice related questions.   Thank you for this referral.   Adele Barthel, RN, BSN [facility] Liaison [phone]  All hospital liaisons are now on AMION.

## 2017-06-20 LAB — CULTURE, BLOOD (ROUTINE X 2)
Culture: NO GROWTH
Culture: NO GROWTH
SPECIAL REQUESTS: ADEQUATE
Special Requests: ADEQUATE

## 2017-06-30 DEATH — deceased

## 2018-04-28 IMAGING — CT CT HEAD W/O CM
3 of 4 series · 15 of 47 positions shown, 18 images · non-contrast
Comparison: None.

CLINICAL DATA: Worsen altered mental status.  History of stroke.

EXAM:
CT HEAD WITHOUT CONTRAST
TECHNIQUE: Contiguous axial images were obtained from the base of the skull
through the vertex without intravenous contrast.

[Series 2: head w/o · axial · non-contrast · 0.45mm/px · z∈[+1398,+1514]mm · 9 of 29 slices shown, 12 images]
[im 3/29  brain]
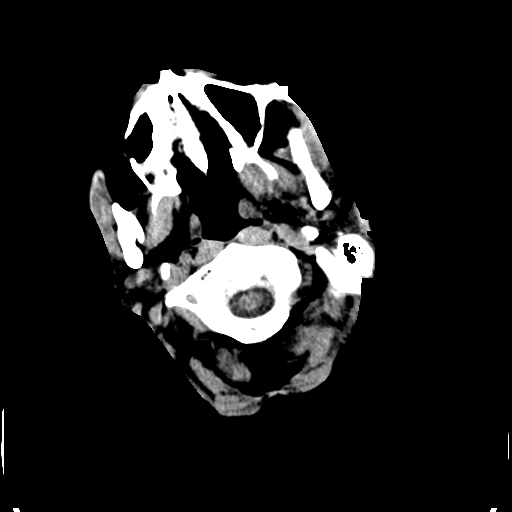
[im 3/29  bone]
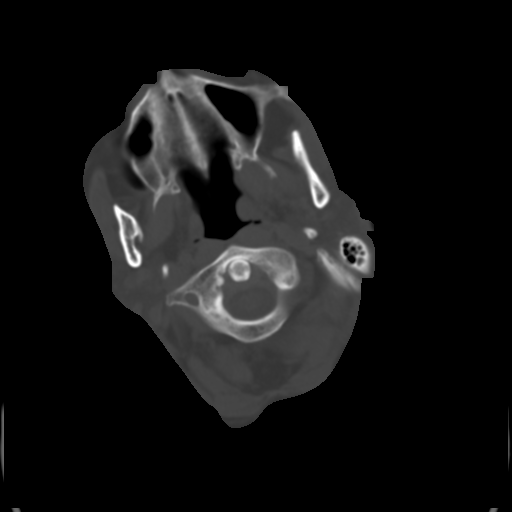
[im 6/29  brain]
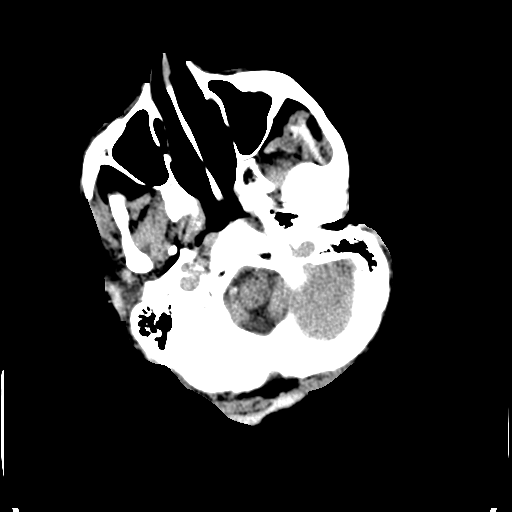
[im 9/29  brain]
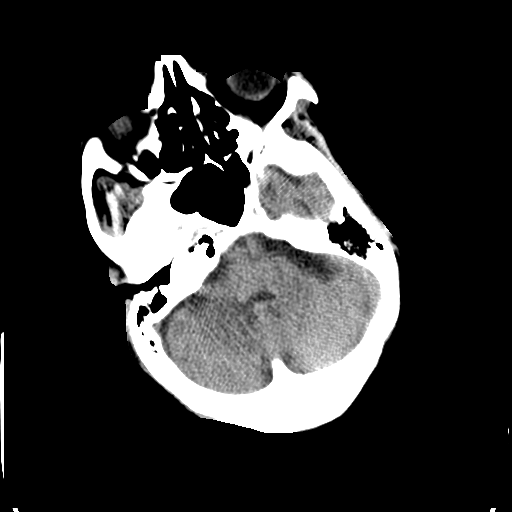
[im 12/29  brain]
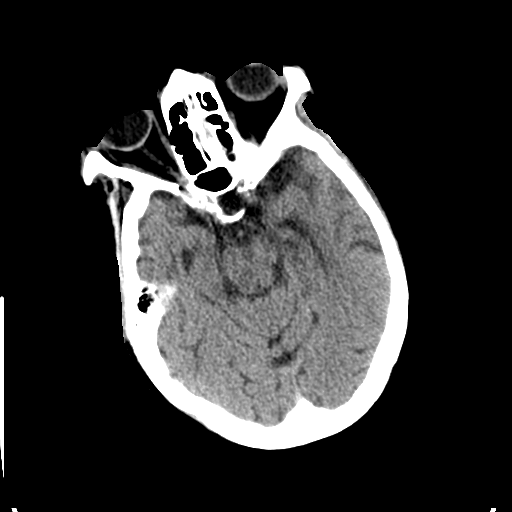
[im 15/29  brain]
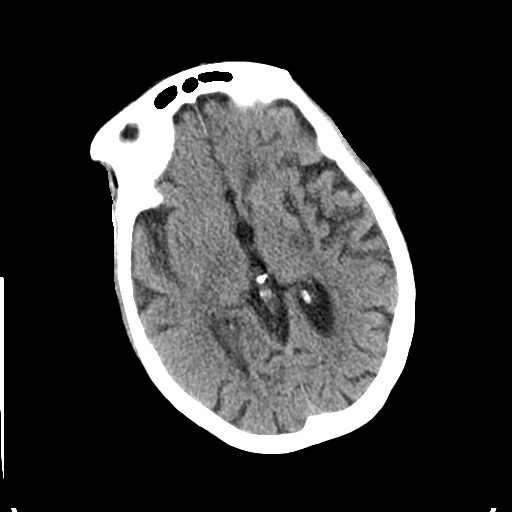
[im 15/29  bone]
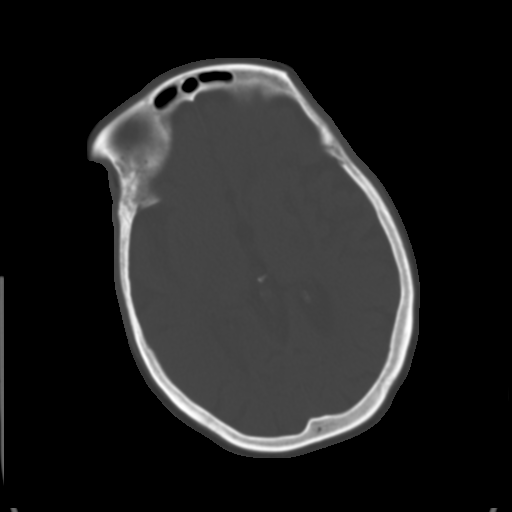
[im 17/29  brain]
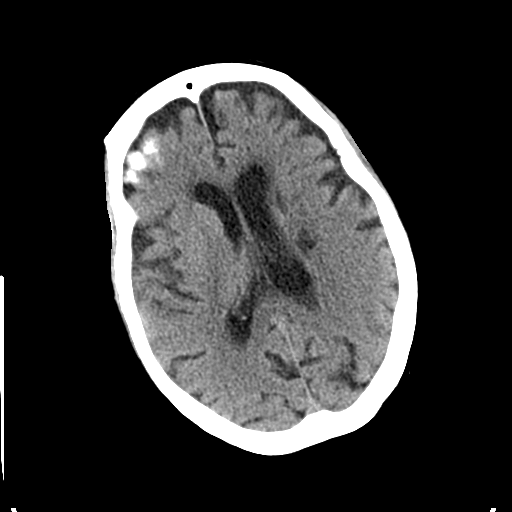
[im 20/29  brain]
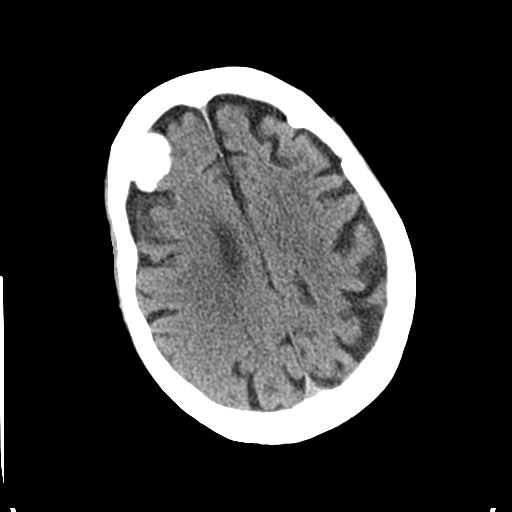
[im 23/29  brain]
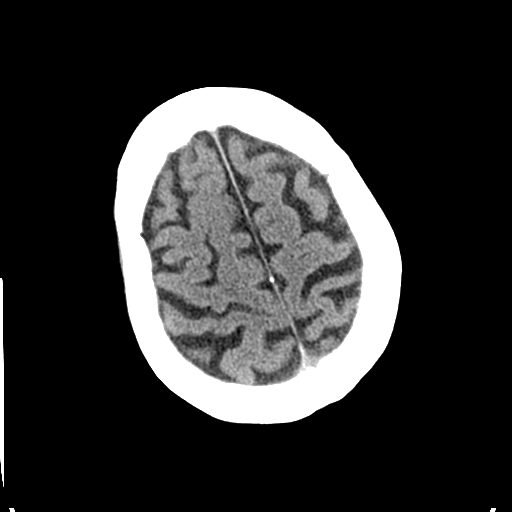
[im 26/29  brain]
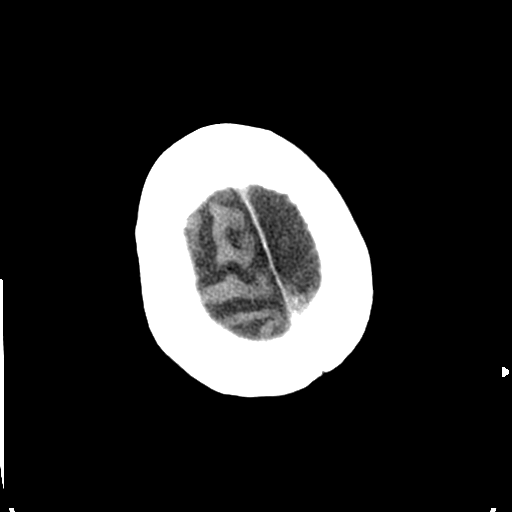
[im 26/29  bone]
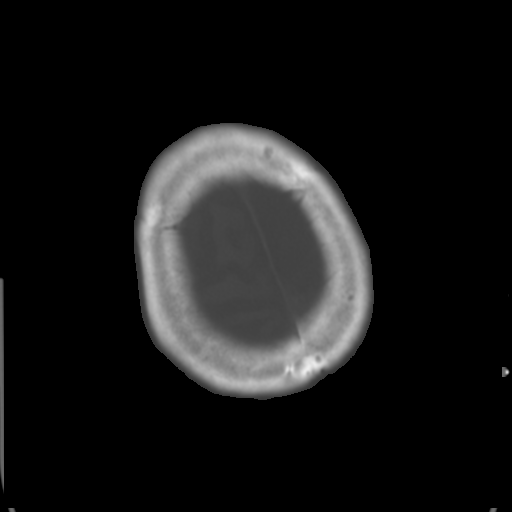

[Series 4: coronal · coronal · 0.27mm/px · 3 of 62 slices shown]
[im 21/62  brain]
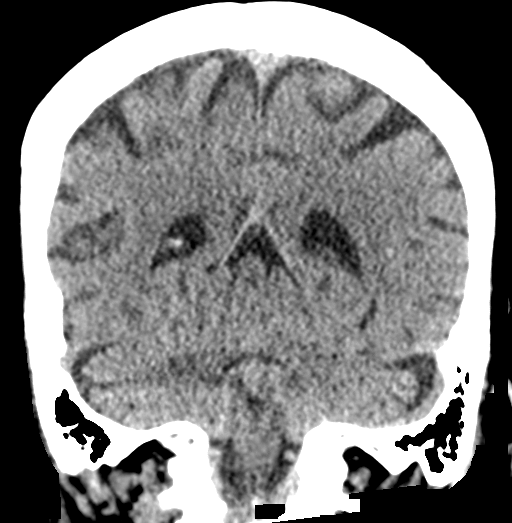
[im 28/62  brain]
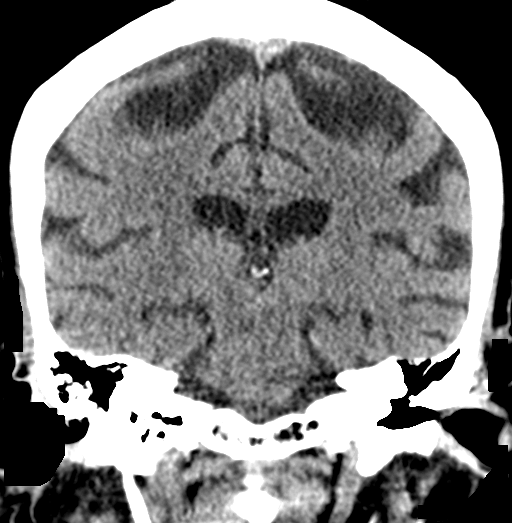
[im 34/62  brain]
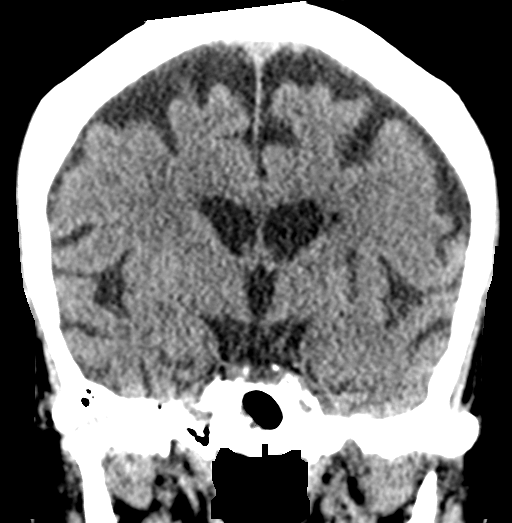

[Series 5: sagittal · sagittal · 0.29mm/px · 3 of 46 slices shown]
[im 16/46  brain]
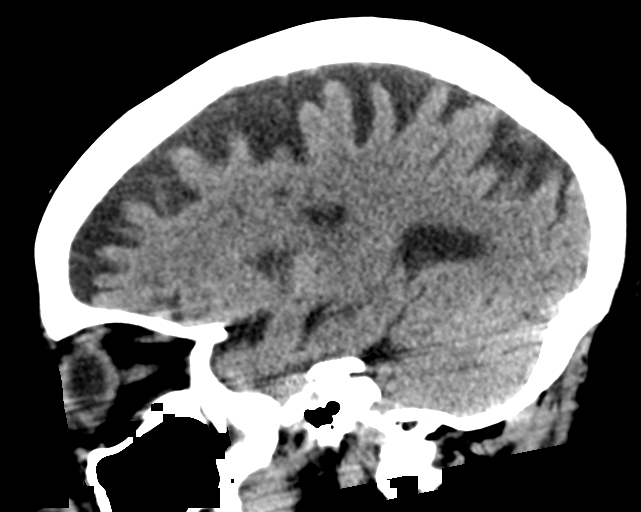
[im 23/46  brain]
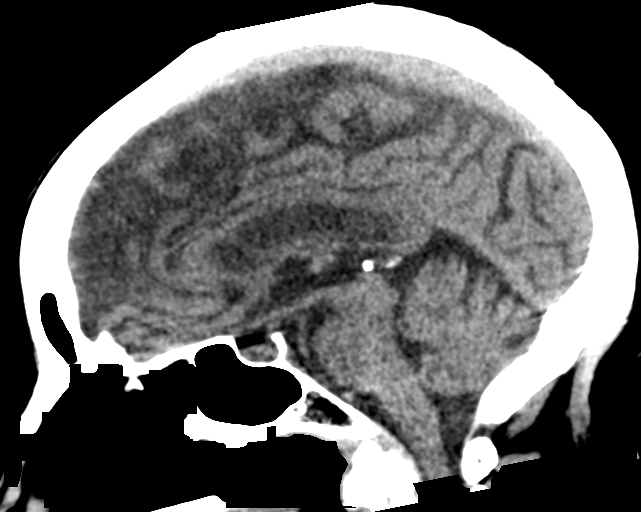
[im 31/46  brain]
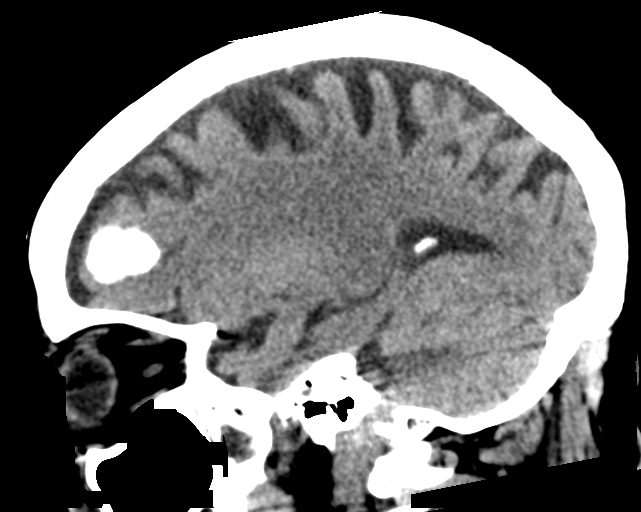

[15 of 47 positions shown; findings below may reference images not displayed]

FINDINGS: Brain: Generalized age related atrophy. The brainstem and cerebellum
are unremarkable. There is old infarction in the left basal ganglia
and radiating white matter tracts. There is an old left posterior
frontal cortical infarction. No sign of acute infarction,
intra-axial mass lesion, hemorrhage, hydrocephalus or extra-axial
fluid collection. There is a calcified meningioma in the right
frontal region measuring 3 cm in diameter. This indents the right
frontal lobe slightly but does not appear to be associated with any
edema or significant mass effect. There is a 7 mm sessile meningioma
in the left frontal region without mass effect.

Vascular: There is atherosclerotic calcification of the major
vessels at the base of the brain.

Skull: Otherwise negative

Sinuses/Orbits: Clear/normal

Other: None
IMPRESSION: No acute finding by CT.

Old left basal ganglia and radiating white matter infarctions. Old
left frontal cortical and subcortical infarction.

Calcified right frontal meningioma measuring 3 cm. Slight
indentation of the right frontal lobe, not likely significant. 7 mm
sessile meningioma on the left in the frontal region, not
significant.
